# Patient Record
Sex: Female | Born: 1985 | Race: Black or African American | Hispanic: No | Smoking: Never smoker
Health system: Southern US, Community
[De-identification: ages and names within clinical notes are randomized; demographics above are authoritative.]

## PROBLEM LIST (undated history)

## (undated) DIAGNOSIS — T7840XA Allergy, unspecified, initial encounter: Secondary | ICD-10-CM

## (undated) DIAGNOSIS — F419 Anxiety disorder, unspecified: Secondary | ICD-10-CM

## (undated) DIAGNOSIS — A749 Chlamydial infection, unspecified: Secondary | ICD-10-CM

## (undated) DIAGNOSIS — F329 Major depressive disorder, single episode, unspecified: Secondary | ICD-10-CM

## (undated) DIAGNOSIS — IMO0002 Reserved for concepts with insufficient information to code with codable children: Secondary | ICD-10-CM

## (undated) DIAGNOSIS — F32A Depression, unspecified: Secondary | ICD-10-CM

## (undated) HISTORY — DX: Chlamydial infection, unspecified: A74.9

## (undated) HISTORY — DX: Depression, unspecified: F32.A

## (undated) HISTORY — DX: Reserved for concepts with insufficient information to code with codable children: IMO0002

## (undated) HISTORY — DX: Major depressive disorder, single episode, unspecified: F32.9

## (undated) HISTORY — DX: Allergy, unspecified, initial encounter: T78.40XA

## (undated) HISTORY — DX: Anxiety disorder, unspecified: F41.9

---
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
## 2005-04-12 ENCOUNTER — Ambulatory Visit: Payer: Self-pay | Admitting: Family Medicine

## 2005-06-21 ENCOUNTER — Ambulatory Visit: Payer: Self-pay | Admitting: Family Medicine

## 2006-06-05 ENCOUNTER — Ambulatory Visit: Payer: Self-pay | Admitting: Family Medicine

## 2006-07-19 ENCOUNTER — Ambulatory Visit: Payer: Self-pay | Admitting: Family Medicine

## 2007-01-07 ENCOUNTER — Ambulatory Visit: Payer: Self-pay | Admitting: Family Medicine

## 2007-01-09 ENCOUNTER — Ambulatory Visit: Payer: Self-pay | Admitting: Family Medicine

## 2010-06-05 ENCOUNTER — Emergency Department (HOSPITAL_COMMUNITY): Admission: EM | Admit: 2010-06-05 | Discharge: 2010-06-05 | Payer: Self-pay | Admitting: Emergency Medicine

## 2010-06-16 ENCOUNTER — Ambulatory Visit: Payer: Self-pay | Admitting: Family Medicine

## 2010-06-16 ENCOUNTER — Other Ambulatory Visit: Admission: RE | Admit: 2010-06-16 | Discharge: 2010-06-16 | Payer: Self-pay | Admitting: Family Medicine

## 2010-06-17 DIAGNOSIS — R87612 Low grade squamous intraepithelial lesion on cytologic smear of cervix (LGSIL): Secondary | ICD-10-CM

## 2010-06-17 DIAGNOSIS — IMO0002 Reserved for concepts with insufficient information to code with codable children: Secondary | ICD-10-CM

## 2010-06-17 HISTORY — DX: Reserved for concepts with insufficient information to code with codable children: IMO0002

## 2010-06-17 HISTORY — DX: Low grade squamous intraepithelial lesion on cytologic smear of cervix (LGSIL): R87.612

## 2010-06-28 ENCOUNTER — Encounter: Payer: Self-pay | Admitting: Physician Assistant

## 2010-12-06 NOTE — Letter (Signed)
Summary: FAXED REQUESTED INFO TO [facility]  FAXED REQUESTED INFO TO [facility]   Imported By: Arta Bruce 06/28/2010 14:21:47  _____________________________________________________________________  External Attachment:    Type:   Image     Comment:   External Document

## 2011-01-04 ENCOUNTER — Institutional Professional Consult (permissible substitution) (INDEPENDENT_AMBULATORY_CARE_PROVIDER_SITE_OTHER): Payer: BC Managed Care – PPO | Admitting: Family Medicine

## 2011-01-04 DIAGNOSIS — A749 Chlamydial infection, unspecified: Secondary | ICD-10-CM

## 2011-01-04 DIAGNOSIS — Z3009 Encounter for other general counseling and advice on contraception: Secondary | ICD-10-CM

## 2011-01-21 LAB — CBC
HCT: 37.2 % (ref 36.0–46.0)
Hemoglobin: 12.4 g/dL (ref 12.0–15.0)
MCH: 28.1 pg (ref 26.0–34.0)
MCHC: 33.5 g/dL (ref 30.0–36.0)
MCV: 84.1 fL (ref 78.0–100.0)
Platelets: 185 10*3/uL (ref 150–400)
RBC: 4.42 MIL/uL (ref 3.87–5.11)
RDW: 13.5 % (ref 11.5–15.5)
WBC: 7.9 10*3/uL (ref 4.0–10.5)

## 2011-01-21 LAB — D-DIMER, QUANTITATIVE: D-Dimer, Quant: 2.38 ug/mL-FEU — ABNORMAL HIGH (ref 0.00–0.48)

## 2011-01-21 LAB — COMPREHENSIVE METABOLIC PANEL
ALT: <8 U/L (ref 0–35)
AST: 15 U/L (ref 0–37)
Albumin: 3.6 g/dL (ref 3.5–5.2)
Alkaline Phosphatase: 53 U/L (ref 39–117)
BUN: 6 mg/dL (ref 6–23)
CO2: 28 mEq/L (ref 19–32)
Calcium: 8.8 mg/dL (ref 8.4–10.5)
Chloride: 104 mEq/L (ref 96–112)
Creatinine, Ser: 0.65 mg/dL (ref 0.4–1.2)
GFR calc Af Amer: >60 mL/min (ref >60)
GFR calc non Af Amer: >60 mL/min (ref >60)
Glucose, Bld: 86 mg/dL (ref 70–99)
Potassium: 3.4 mEq/L — ABNORMAL LOW (ref 3.5–5.1)
Sodium: 140 mEq/L (ref 135–145)
Total Bilirubin: 1.6 mg/dL — ABNORMAL HIGH (ref 0.3–1.2)
Total Protein: 7.2 g/dL (ref 6.0–8.3)

## 2011-01-21 LAB — URINALYSIS, ROUTINE W REFLEX MICROSCOPIC
Bilirubin Urine: NEGATIVE
Hgb urine dipstick: NEGATIVE
Ketones, ur: 15 mg/dL — AB
Nitrite: POSITIVE — AB
Urobilinogen, UA: 4 mg/dL — ABNORMAL HIGH (ref 0.0–1.0)

## 2011-01-21 LAB — DIFFERENTIAL
Basophils Absolute: 0 10*3/uL (ref 0.0–0.1)
Basophils Relative: 0 % (ref 0–1)
Eosinophils Absolute: 0 10*3/uL (ref 0.0–0.7)
Eosinophils Relative: 0 % (ref 0–5)
Neutrophils Relative %: 82 % — ABNORMAL HIGH (ref 43–77)

## 2011-01-21 LAB — URINE CULTURE
Colony Count: >=100000
Culture  Setup Time: 201107312126

## 2011-01-21 LAB — POCT PREGNANCY, URINE: Preg Test, Ur: NEGATIVE

## 2011-01-21 LAB — LIPASE, BLOOD: Lipase: 23 U/L (ref 11–59)

## 2011-01-23 ENCOUNTER — Ambulatory Visit: Payer: BC Managed Care – PPO | Admitting: Family Medicine

## 2011-01-26 ENCOUNTER — Ambulatory Visit: Payer: BC Managed Care – PPO | Admitting: Family Medicine

## 2011-02-01 ENCOUNTER — Other Ambulatory Visit: Payer: Self-pay | Admitting: Family Medicine

## 2011-02-01 ENCOUNTER — Other Ambulatory Visit (INDEPENDENT_AMBULATORY_CARE_PROVIDER_SITE_OTHER): Payer: BC Managed Care – PPO | Admitting: Family Medicine

## 2011-02-01 ENCOUNTER — Other Ambulatory Visit (HOSPITAL_COMMUNITY)
Admission: RE | Admit: 2011-02-01 | Discharge: 2011-02-01 | Disposition: A | Discharge status: Home / Self Care | Payer: BC Managed Care – PPO | Source: Ambulatory Visit | Attending: Family Medicine | Admitting: Family Medicine

## 2011-02-01 DIAGNOSIS — Z124 Encounter for screening for malignant neoplasm of cervix: Secondary | ICD-10-CM | POA: Insufficient documentation

## 2011-02-01 DIAGNOSIS — N760 Acute vaginitis: Secondary | ICD-10-CM

## 2011-02-01 DIAGNOSIS — R87619 Unspecified abnormal cytological findings in specimens from cervix uteri: Secondary | ICD-10-CM

## 2011-06-27 ENCOUNTER — Other Ambulatory Visit: Payer: Self-pay | Admitting: Family Medicine

## 2011-06-28 NOTE — Telephone Encounter (Signed)
Rx request 

## 2011-06-28 NOTE — Telephone Encounter (Signed)
Chart reviewed.  Due for pap.  Okay to refill x 2 and schedule CPE/pap.  Thanks

## 2011-06-28 NOTE — Telephone Encounter (Signed)
PT SCHEDULED CPE FOR 08/09/2011 @ 9:30AM

## 2011-08-08 ENCOUNTER — Encounter: Payer: Self-pay | Admitting: Family Medicine

## 2011-08-09 ENCOUNTER — Encounter: Payer: BC Managed Care – PPO | Admitting: Family Medicine

## 2011-10-27 ENCOUNTER — Encounter: Payer: BC Managed Care – PPO | Admitting: Medical

## 2011-12-01 ENCOUNTER — Encounter: Payer: Self-pay | Admitting: Internal Medicine

## 2011-12-06 ENCOUNTER — Encounter: Payer: BC Managed Care – PPO | Admitting: Family Medicine

## 2011-12-29 ENCOUNTER — Other Ambulatory Visit (HOSPITAL_COMMUNITY)
Admission: RE | Admit: 2011-12-29 | Discharge: 2011-12-29 | Disposition: A | Discharge status: Home / Self Care | Payer: BC Managed Care – PPO | Source: Ambulatory Visit | Attending: Family Medicine | Admitting: Family Medicine

## 2011-12-29 ENCOUNTER — Ambulatory Visit (INDEPENDENT_AMBULATORY_CARE_PROVIDER_SITE_OTHER): Payer: BC Managed Care – PPO | Admitting: Family Medicine

## 2011-12-29 ENCOUNTER — Encounter: Payer: Self-pay | Admitting: Family Medicine

## 2011-12-29 VITALS — BP 108/72 | HR 80 | Ht 66.5 in | Wt 137.0 lb

## 2011-12-29 DIAGNOSIS — R6889 Other general symptoms and signs: Secondary | ICD-10-CM

## 2011-12-29 DIAGNOSIS — Z309 Encounter for contraceptive management, unspecified: Secondary | ICD-10-CM

## 2011-12-29 DIAGNOSIS — Z01419 Encounter for gynecological examination (general) (routine) without abnormal findings: Secondary | ICD-10-CM | POA: Insufficient documentation

## 2011-12-29 DIAGNOSIS — Z0000 Encounter for general adult medical examination without abnormal findings: Secondary | ICD-10-CM

## 2011-12-29 DIAGNOSIS — Z13220 Encounter for screening for lipoid disorders: Secondary | ICD-10-CM

## 2011-12-29 DIAGNOSIS — N930 Postcoital and contact bleeding: Secondary | ICD-10-CM

## 2011-12-29 DIAGNOSIS — IMO0002 Reserved for concepts with insufficient information to code with codable children: Secondary | ICD-10-CM

## 2011-12-29 LAB — POCT URINALYSIS DIPSTICK
Glucose, UA: NEGATIVE
Ketones, UA: NEGATIVE
Spec Grav, UA: 1.02
Urobilinogen, UA: NEGATIVE

## 2011-12-29 MED ORDER — NORETHIN-ETH ESTRAD TRIPHASIC 0.5/0.75/1-35 MG-MCG PO TABS: 1.0000 | ORAL_TABLET | Freq: Every day | ORAL | Status: DC

## 2011-12-29 NOTE — Patient Instructions (Addendum)
HEALTH MAINTENANCE RECOMMENDATIONS:  It is recommended that you get at least 30 minutes of aerobic exercise at least 5 days/week (for weight loss, you may need as much as 60-90 minutes). This can be any activity that gets your heart rate up. This can be divided in 10-15 minute intervals if needed, but try and build up your endurance at least once a week.  Weight bearing exercise is also recommended twice weekly.  Eat a healthy diet with lots of vegetables, fruits and fiber.  "Colorful" foods have a lot of vitamins (ie green vegetables, tomatoes, red peppers, etc).  Limit sweet tea, regular sodas and alcoholic beverages, all of which has a lot of calories and sugar.  Up to 1 alcoholic drink daily may be beneficial for women (unless trying to lose weight, watch sugars).  Drink a lot of water.  Calcium recommendations are 1200-1500 mg daily (1500 mg for postmenopausal women or women without ovaries), and vitamin D 1000 IU daily.  This should be obtained from diet and/or supplements (vitamins), and calcium should not be taken all at once, but in divided doses.  Monthly self breast exams and yearly mammograms for women over the age of 68 is recommended.  Sunscreen of at least SPF 30 should be used on all sun-exposed parts of the skin when outside between the hours of 10 am and 4 pm (not just when at beach or pool, but even with exercise, golf, tennis, and yard work!)  Use a sunscreen that says "broad spectrum" so it covers both UVA and UVB rays, and make sure to reapply every 1-2 hours.  Remember to change the batteries in your smoke detectors when changing your clock times in the spring and fall.  Use your seat belt every time you are in a car, and please drive safely and not be distracted with cell phones and texting while driving.   Contraceptive management--refill OCP's.  To start Sunday after next period starts.  You are not protected from pregnancy until 2nd month of pills.  Encouraged to use  condoms for prevention of STD's (and for prevention of pregnancy until you on second month of pills to prevent pregnancy).

## 2011-12-29 NOTE — Progress Notes (Signed)
Maureen Waters is a 26 y.o. female who presents for a complete physical.  She has the following concerns:  Complaining of some spotting with intercourse x 2 weeks.  Denies any abnormal vaginal discharge--no odor, itching, or discolored discharge.  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  She returned in 01/2011 and repeat pap then was normal.  Was recommended she return for additional repeat pap in 4 months, but she never returned.  Treated for chlamydia in 12/2010 (that was noted on pap 05/2010), and repeat testing in March was negative.  Has been off of her OCP's for 3 months.  Uses condoms most of the time, but has had unprotected sex.  Hasn't had unprotected sex since her last period, last week.  Period was normal, on time.  Immunization History  Administered Date(s) Administered  . MMR 05/31/2004  . Tdap 06/16/2010   Last Pap smear: 01/2011 Last mammogram:  never Last colonoscopy: never Last DEXA: never Dentist: yearly Ophtho: never Exercise: 3-4 times/week cardio and weights  Past Medical History  Diagnosis Date  . Allergy     HX OF SEASONAL ALLERGIES  . 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  01/2011 pap was normal  . Chlamydia     06/2010    History reviewed. No pertinent past surgical history.  History   Social History  . Marital Status: Single    Spouse Name: N/A    Number of Children: 0  . Years of Education: N/A   Occupational History  . Production designer, theatre/television/film at [employer]     Social History Main Topics  . Smoking status: Never Smoker   . Smokeless tobacco: Never Used  . Alcohol Use: No  . Drug Use: No  . Sexually Active: Yes -- Female partner(s)    Birth Control/ Protection: Condom, OCP   Other Topics Concern  . Not on file   Social History Narrative   Lives alone, no pets.  Family lives in [place]    Family History  Problem Relation Age of Onset  .  Arthritis Maternal Grandmother   . Diabetes Maternal Grandfather   . Hypertension Maternal Grandfather   . Cancer Neg Hx   . Asthma Brother     and allergies    Current outpatient prescriptions:norethindrone-ethinyl estradiol (NORTREL 7/7/7) 0.5/0.75/1-35 MG-MCG tablet, Take 1 tablet by mouth daily., Disp: 1 Package, Rfl: 11  No Known Allergies  ROS: The patient denies anorexia, fever, weight changes, headaches,  vision changes, decreased hearing, ear pain, sore throat, breast concerns, chest pain, palpitations, dizziness, syncope, dyspnea on exertion, cough, swelling, nausea, vomiting, diarrhea, constipation, abdominal pain, melena, hematochezia, indigestion/heartburn, hematuria, incontinence, dysuria, irregular menstrual cycles, vaginal discharge, odor or itch, genital lesions, joint pains, numbness, tingling, weakness, tremor, suspicious skin lesions, depression, anxiety, abnormal bleeding/bruising, or enlarged lymph nodes.  PHYSICAL EXAM: BP 108/72  Pulse 80  Ht 5' 6.5" (1.689 m)  Wt 137 lb (62.143 kg)  BMI 21.78 kg/m2  LMP 12/18/2011  General Appearance:    Alert, cooperative, no distress, appears stated age  Head:    Normocephalic, without obvious abnormality, atraumatic  Eyes:    PERRL, conjunctiva/corneas clear, EOM's intact, fundi    benign  Ears:    Normal TM's and external ear canals  Nose:   Nares normal, mucosa normal, no drainage or sinus   tenderness  Throat:   Lips, mucosa, and tongue normal; teeth and gums  normal  Neck:   Supple, no lymphadenopathy;  thyroid:  no   enlargement/tenderness/nodules; no carotid   bruit or JVD  Back:    Spine nontender, no curvature, ROM normal, no CVA     tenderness  Lungs:     Clear to auscultation bilaterally without wheezes, rales or     ronchi; respirations unlabored  Chest Wall:    No tenderness or deformity   Heart:    Regular rate and rhythm, S1 and S2 normal, no murmur, rub   or gallop  Breast Exam:    No tenderness, masses,  or nipple discharge or inversion.      No axillary lymphadenopathy  Abdomen:     Soft, non-tender, nondistended, normoactive bowel sounds,    no masses, no hepatosplenomegaly  Genitalia:    Normal external genitalia without lesions.  BUS and vagina normal; Cervix--inflamed, easily bleeding.  Blood obscured some of exam, but appeared to have slightly abnormal tissue on upper part of cervix.  Whitish yellow discharge at cervical os.  No cervical motion tenderness.  Uterus and adnexa not enlarged, nontender, no masses.  Pap performed  Rectal:    Not performed due to age<40 and no related complaints  Extremities:   No clubbing, cyanosis or edema  Pulses:   2+ and symmetric all extremities  Skin:   Skin color, texture, turgor normal, no rashes or lesions  Lymph nodes:   Cervical, supraclavicular, and axillary nodes normal  Neurologic:   CNII-XII intact, normal strength, sensation and gait; reflexes 2+ and symmetric throughout          Psych:   Normal mood, affect, hygiene and grooming.    ASSESSMENT/PLAN:  1. Routine general medical examination at a health care facility  POCT Urinalysis Dipstick, Visual acuity screening, Cytology - PAP, CBC with Differential, Lipid panel, HIV Antibody  2. Screening for lipoid disorders  Lipid panel  3. Abnormal Pap smear  Cytology - PAP  4. Postcoital bleeding  Cytology - PAP, CBC with Differential  5. Unspecified contraceptive management  norethindrone-ethinyl estradiol (NORTREL 7/7/7) 0.5/0.75/1-35 MG-MCG tablet    Contraceptive management--refill OCP's.  To start Sunday after her next period.  Understands that she is not protected from pregnancy until 2nd month of pills.  Encouraged to use condoms for prevention of STD's (and for prevention of pregnancy until she is on second month of pills)  Discussed monthly self breast exams and yearly mammograms after the age of 79; at least 30 minutes of aerobic activity at least 5 days/week; proper sunscreen use reviewed;  healthy diet, including goals of calcium and vitamin D intake and alcohol recommendations (less than or equal to 1 drink/day) reviewed; regular seatbelt use; changing batteries in smoke detectors.  Immunization recommendations discussed--recommended annual flu shots.

## 2011-12-30 LAB — CBC WITH DIFFERENTIAL/PLATELET
Eosinophils Absolute: 0 10*3/uL (ref 0.0–0.7)
Eosinophils Relative: 1 % (ref 0–5)
HCT: 41.4 % (ref 36.0–46.0)
Lymphocytes Relative: 49 % — ABNORMAL HIGH (ref 12–46)
Lymphs Abs: 1.5 10*3/uL (ref 0.7–4.0)
MCH: 26.6 pg (ref 26.0–34.0)
MCV: 83.5 fL (ref 78.0–100.0)
Monocytes Absolute: 0.3 10*3/uL (ref 0.1–1.0)
Monocytes Relative: 9 % (ref 3–12)
Platelets: 212 10*3/uL (ref 150–400)
RBC: 4.96 MIL/uL (ref 3.87–5.11)
WBC: 3 10*3/uL — ABNORMAL LOW (ref 4.0–10.5)

## 2011-12-30 LAB — LIPID PANEL
HDL: 61 mg/dL (ref >39)
LDL Cholesterol: 52 mg/dL (ref 0–99)
Total CHOL/HDL Ratio: 2 Ratio
VLDL: 6 mg/dL (ref 0–40)

## 2011-12-30 LAB — HIV ANTIBODY (ROUTINE TESTING W REFLEX): HIV: NONREACTIVE

## 2012-01-03 ENCOUNTER — Other Ambulatory Visit: Payer: Self-pay | Admitting: *Deleted

## 2012-01-03 DIAGNOSIS — A749 Chlamydial infection, unspecified: Secondary | ICD-10-CM

## 2012-01-03 MED ORDER — AZITHROMYCIN 500 MG PO TABS: 1000.0000 mg | ORAL_TABLET | Freq: Once | ORAL | Status: DC
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
## 2012-01-31 ENCOUNTER — Encounter (INDEPENDENT_AMBULATORY_CARE_PROVIDER_SITE_OTHER): Payer: BC Managed Care – PPO | Admitting: Family Medicine

## 2012-02-07 ENCOUNTER — Ambulatory Visit: Payer: BC Managed Care – PPO | Admitting: Family Medicine

## 2013-04-21 ENCOUNTER — Encounter: Payer: Self-pay | Admitting: Family Medicine

## 2013-04-23 NOTE — Progress Notes (Signed)
Patient ID: Maureen Waters, female   DOB: 1986/01/07, 27 y.o.   MRN: [id number] ek/error

## 2013-06-25 ENCOUNTER — Encounter (HOSPITAL_BASED_OUTPATIENT_CLINIC_OR_DEPARTMENT_OTHER): Payer: Self-pay

## 2013-06-25 ENCOUNTER — Emergency Department (HOSPITAL_BASED_OUTPATIENT_CLINIC_OR_DEPARTMENT_OTHER): Payer: Self-pay

## 2013-06-25 ENCOUNTER — Emergency Department (HOSPITAL_BASED_OUTPATIENT_CLINIC_OR_DEPARTMENT_OTHER)
Admission: EM | Admit: 2013-06-25 | Discharge: 2013-06-25 | Disposition: A | Discharge status: Home / Self Care | Payer: Self-pay | Attending: Emergency Medicine | Admitting: Emergency Medicine

## 2013-06-25 DIAGNOSIS — S6000XA Contusion of unspecified finger without damage to nail, initial encounter: Secondary | ICD-10-CM | POA: Insufficient documentation

## 2013-06-25 DIAGNOSIS — R209 Unspecified disturbances of skin sensation: Secondary | ICD-10-CM | POA: Insufficient documentation

## 2013-06-25 DIAGNOSIS — W230XXA Caught, crushed, jammed, or pinched between moving objects, initial encounter: Secondary | ICD-10-CM | POA: Insufficient documentation

## 2013-06-25 DIAGNOSIS — Z8619 Personal history of other infectious and parasitic diseases: Secondary | ICD-10-CM | POA: Insufficient documentation

## 2013-06-25 DIAGNOSIS — S60011A Contusion of right thumb without damage to nail, initial encounter: Secondary | ICD-10-CM

## 2013-06-25 DIAGNOSIS — Y9389 Activity, other specified: Secondary | ICD-10-CM | POA: Insufficient documentation

## 2013-06-25 DIAGNOSIS — Y9289 Other specified places as the place of occurrence of the external cause: Secondary | ICD-10-CM | POA: Insufficient documentation

## 2013-06-25 NOTE — ED Notes (Signed)
Slammed right thumb in car door

## 2013-06-25 NOTE — ED Provider Notes (Signed)
CSN: [id number]     Arrival date & time 06/25/13  1922 History     First MD Initiated Contact with Patient 06/25/13 2043     Chief Complaint  Patient presents with  . Finger Injury   (Consider location/radiation/quality/duration/timing/severity/associated sxs/prior Treatment) Patient is a 27 y.o. female presenting with hand injury.  Hand Injury Location:  Finger Injury: yes   Mechanism of injury: crush   Crush injury:    Mechanism:  Door   Approximate weight of object:  Car door Finger location:  R thumb Pain details:    Quality:  Sharp and throbbing   Radiates to:  R forearm   Severity:  Moderate   Onset quality:  Sudden   Timing:  Constant   Progression:  Unchanged Chronicity:  New Handedness:  Right-handed Dislocation: no   Foreign body present:  No foreign bodies Prior injury to area:  No Relieved by:  Nothing Worsened by:  Nothing tried Ineffective treatments:  None tried Associated symptoms: numbness   Associated symptoms: no back pain, no decreased range of motion, no fatigue, no fever and no muscle weakness     Past Medical History  Diagnosis Date  . Allergy     HX OF SEASONAL ALLERGIES  . 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  01/2011 pap was normal  . Chlamydia     06/2010   History reviewed. No pertinent past surgical history. Family History  Problem Relation Age of Onset  . Arthritis Maternal Grandmother   . Diabetes Maternal Grandfather   . Hypertension Maternal Grandfather   . Cancer Neg Hx   . Asthma Brother     and allergies   History  Substance Use Topics  . Smoking status: Never Smoker   . Smokeless tobacco: Never Used  . Alcohol Use: No   OB History   Grav Para Term Preterm Abortions TAB SAB Ect Mult Living   0 0 0 0 0 0 0 0 0 0      Review of Systems  Constitutional: Negative for fever and fatigue.  Musculoskeletal: Negative for back pain.  All other systems reviewed and  are negative.    Allergies  Review of patient's allergies indicates no known allergies.  Home Medications   Current Outpatient Rx  Name  Route  Sig  Dispense  Refill  . azithromycin (ZITHROMAX) 500 MG tablet   Oral   Take 2 tablets (1,000 mg total) by mouth once.   2 tablet   0   . norethindrone-ethinyl estradiol (NORTREL 7/7/7) 0.5/0.75/1-35 MG-MCG tablet   Oral   Take 1 tablet by mouth daily.   1 Package   11    BP 122/71  Pulse 62  Temp(Src) 98.6 F (37 C) (Oral)  Resp 16  Ht 5\' 6"  (1.676 m)  Wt 135 lb (61.236 kg)  BMI 21.8 kg/m2  SpO2 100%  LMP 06/23/2013 Physical Exam  Nursing note and vitals reviewed. Constitutional: She is oriented to person, place, and time. She appears well-developed and well-nourished.  HENT:  Head: Normocephalic and atraumatic.  Musculoskeletal:       Right hand: She exhibits tenderness. She exhibits normal range of motion, no bony tenderness, normal capillary refill, no deformity and no laceration. Decreased sensation is not present in the ulnar distribution and is not present in the medial distribution. Normal strength noted.       Hands: Neurological: She is alert and oriented to person, place,  and time.  Psychiatric: She has a normal mood and affect. Her behavior is normal. Judgment and thought content normal.    ED Course   Procedures (including critical care time)  Labs Reviewed - No data to display Dg Finger Thumb Right  06/25/2013   *RADIOLOGY REPORT*  Clinical Data: Finger injury  RIGHT THUMB 2+V  Comparison: None.  Findings: Three views of the right thumb submitted.  No acute fracture or subluxation.  IMPRESSION: No acute fracture or subluxation.   Original Report Authenticated By: Natasha Mead, M.D.   1. Thumb contusion, right, initial encounter     MDM    Hilario Quarry, MD 06/25/13 [phone]

## 2014-06-08 ENCOUNTER — Encounter (HOSPITAL_COMMUNITY): Payer: Self-pay | Admitting: Emergency Medicine

## 2014-06-08 ENCOUNTER — Emergency Department (HOSPITAL_COMMUNITY)
Admission: EM | Admit: 2014-06-08 | Discharge: 2014-06-08 | Disposition: A | Discharge status: Home / Self Care | Payer: No Typology Code available for payment source | Attending: Emergency Medicine | Admitting: Emergency Medicine

## 2014-06-08 DIAGNOSIS — Y92410 Unspecified street and highway as the place of occurrence of the external cause: Secondary | ICD-10-CM | POA: Insufficient documentation

## 2014-06-08 DIAGNOSIS — IMO0002 Reserved for concepts with insufficient information to code with codable children: Secondary | ICD-10-CM | POA: Diagnosis present

## 2014-06-08 DIAGNOSIS — Z8619 Personal history of other infectious and parasitic diseases: Secondary | ICD-10-CM | POA: Insufficient documentation

## 2014-06-08 DIAGNOSIS — Y9389 Activity, other specified: Secondary | ICD-10-CM | POA: Diagnosis not present

## 2014-06-08 DIAGNOSIS — M549 Dorsalgia, unspecified: Secondary | ICD-10-CM

## 2014-06-08 MED ORDER — OXYCODONE-ACETAMINOPHEN 5-325 MG PO TABS: 1.0000 | ORAL_TABLET | ORAL | Status: DC | PRN

## 2014-06-08 MED ORDER — METHOCARBAMOL 500 MG PO TABS: 500.0000 mg | ORAL_TABLET | Freq: Two times a day (BID) | ORAL | Status: DC
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
## 2014-06-08 NOTE — ED Notes (Signed)
Declined W/C at D/C and was escorted to lobby by RN. 
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
## 2014-06-08 NOTE — Discharge Instructions (Signed)
Take the prescribed medication as directed.  You will continue to be sore for the next few days which is normal.  May wish to apply heat to affected areas for added relief. Follow-up with your primary care physician. Return to the ED for new or worsening symptoms.

## 2014-06-09 NOTE — ED Provider Notes (Signed)
Medical screening examination/treatment/procedure(s) were performed by non-physician practitioner and as supervising physician I was immediately available for consultation/collaboration.   EKG Interpretation None       Kregg Cihlar, MD 06/09/14 0110 

## 2014-07-07 ENCOUNTER — Other Ambulatory Visit: Payer: Self-pay | Admitting: Obstetrics and Gynecology

## 2014-07-08 LAB — CYTOLOGY - PAP

## 2014-08-05 ENCOUNTER — Telehealth: Payer: Self-pay | Admitting: Internal Medicine

## 2014-08-05 NOTE — Telephone Encounter (Signed)
[facility] 07/31/14 @ [phone]

## 2014-10-25 ENCOUNTER — Emergency Department (HOSPITAL_BASED_OUTPATIENT_CLINIC_OR_DEPARTMENT_OTHER)
Admission: EM | Admit: 2014-10-25 | Discharge: 2014-10-25 | Disposition: A | Discharge status: Home / Self Care | Payer: 59 | Attending: Emergency Medicine | Admitting: Emergency Medicine

## 2014-10-25 ENCOUNTER — Encounter (HOSPITAL_BASED_OUTPATIENT_CLINIC_OR_DEPARTMENT_OTHER): Payer: Self-pay | Admitting: *Deleted

## 2014-10-25 DIAGNOSIS — R55 Syncope and collapse: Secondary | ICD-10-CM

## 2014-10-25 DIAGNOSIS — Z3202 Encounter for pregnancy test, result negative: Secondary | ICD-10-CM | POA: Insufficient documentation

## 2014-10-25 DIAGNOSIS — R42 Dizziness and giddiness: Secondary | ICD-10-CM | POA: Insufficient documentation

## 2014-10-25 DIAGNOSIS — Z8619 Personal history of other infectious and parasitic diseases: Secondary | ICD-10-CM | POA: Diagnosis not present

## 2014-10-25 DIAGNOSIS — R531 Weakness: Secondary | ICD-10-CM | POA: Diagnosis not present

## 2014-10-25 LAB — URINALYSIS, ROUTINE W REFLEX MICROSCOPIC
Bilirubin Urine: NEGATIVE
GLUCOSE, UA: NEGATIVE mg/dL
KETONES UR: NEGATIVE mg/dL
Nitrite: NEGATIVE
PH: 7.5 (ref 5.0–8.0)
Protein, ur: NEGATIVE mg/dL
SPECIFIC GRAVITY, URINE: 1.003 — AB (ref 1.005–1.030)
Urobilinogen, UA: 1 mg/dL (ref 0.0–1.0)

## 2014-10-25 LAB — BASIC METABOLIC PANEL
Anion gap: 14 (ref 5–15)
BUN: 8 mg/dL (ref 6–23)
CO2: 27 meq/L (ref 19–32)
Calcium: 9.6 mg/dL (ref 8.4–10.5)
Chloride: 100 mEq/L (ref 96–112)
Creatinine, Ser: 0.7 mg/dL (ref 0.50–1.10)
GFR calc Af Amer: >90 mL/min (ref >90)
GFR calc non Af Amer: >90 mL/min (ref >90)
GLUCOSE: 87 mg/dL (ref 70–99)
POTASSIUM: 3.6 meq/L — AB (ref 3.7–5.3)
SODIUM: 141 meq/L (ref 137–147)

## 2014-10-25 LAB — CBC WITH DIFFERENTIAL/PLATELET
Basophils Absolute: 0 10*3/uL (ref 0.0–0.1)
Basophils Relative: 0 % (ref 0–1)
EOS PCT: 0 % (ref 0–5)
Eosinophils Absolute: 0 10*3/uL (ref 0.0–0.7)
HEMATOCRIT: 39.3 % (ref 36.0–46.0)
Hemoglobin: 12.8 g/dL (ref 12.0–15.0)
LYMPHS ABS: 1.8 10*3/uL (ref 0.7–4.0)
LYMPHS PCT: 39 % (ref 12–46)
MCH: 26.8 pg (ref 26.0–34.0)
MCHC: 32.6 g/dL (ref 30.0–36.0)
MCV: 82.2 fL (ref 78.0–100.0)
MONO ABS: 0.4 10*3/uL (ref 0.1–1.0)
Monocytes Relative: 8 % (ref 3–12)
NEUTROS ABS: 2.4 10*3/uL (ref 1.7–7.7)
Neutrophils Relative %: 53 % (ref 43–77)
PLATELETS: 183 10*3/uL (ref 150–400)
RBC: 4.78 MIL/uL (ref 3.87–5.11)
RDW: 12.5 % (ref 11.5–15.5)
WBC: 4.6 10*3/uL (ref 4.0–10.5)

## 2014-10-25 LAB — URINE MICROSCOPIC-ADD ON

## 2014-10-25 LAB — PREGNANCY, URINE: PREG TEST UR: NEGATIVE

## 2014-10-25 MED ORDER — SODIUM CHLORIDE 0.9 % IV BOLUS (SEPSIS)
1000.0000 mL | Freq: Once | INTRAVENOUS | Status: AC
  Administered 2014-10-25: 1000 mL via INTRAVENOUS

## 2014-10-25 NOTE — ED Notes (Signed)
"  feels better, less dizzy", (denies: nausea, HA, pain, sob or other sx).

## 2014-10-25 NOTE — ED Notes (Signed)
Denies questions needs sx or concerns unmet, steady gait, out in w/c.

## 2014-10-25 NOTE — ED Notes (Signed)
Dr. Belfi at [facility] 

## 2014-10-25 NOTE — ED Provider Notes (Signed)
CSN: [id number]     Arrival date & time 10/25/14  2009 History  This chart was scribed for Rolan Bucco, MD by Haywood Pao, ED Scribe. The patient was seen in [facility] and the patient's care was started at 9:36 PM.  Chief Complaint  Patient presents with  . Loss of Consciousness   HPI  HPI Comments: Maureen Waters is a 28 y.o. female who presents to the Emergency Department complaining of LOC She had a dance performance tonight and she passed out while giving out costumes. She was light headed and had nausea at the time of LOC. She was given a cool compress, food and water which improved her symptoms. Then at the next performance she developed weakness and felt like she might pass out again. Pt currently feels dizzy, light headed, has a slight HA. The dizziness is described as room spinning. Pt denies nausea right now, cough, cold, congestion, numbness and weakness in extremities, speech difficulty, CP and heart palpations.  Pt has no history of this complaint, but was light headed before due to lack of food. She has only eaten one time today. 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  Past Medical History  Diagnosis Date  . Allergy     HX OF SEASONAL ALLERGIES  . 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  01/2011 pap was normal  . Chlamydia     06/2010   History reviewed. No pertinent past surgical history. Family History  Problem Relation Age of Onset  . Arthritis Maternal Grandmother   . Diabetes Maternal Grandfather   . Hypertension Maternal Grandfather   . Cancer Neg Hx   . Asthma Brother     and allergies   History  Substance Use Topics  . Smoking status: Never Smoker   . Smokeless tobacco: Never Used  . Alcohol Use: No   OB History    Gravida Para Term Preterm AB TAB SAB Ectopic Multiple Living   0 0 0 0 0 0 0 0 0 0      Review of Systems  Constitutional: Positive for fatigue. Negative for fever, chills and diaphoresis.   HENT: Negative for congestion, rhinorrhea and sneezing.   Eyes: Negative.   Respiratory: Negative for cough, chest tightness and shortness of breath.   Cardiovascular: Negative for chest pain, palpitations and leg swelling.  Gastrointestinal: Negative for nausea, vomiting, abdominal pain, diarrhea and blood in stool.  Genitourinary: Negative for frequency, hematuria, flank pain and difficulty urinating.  Musculoskeletal: Negative for back pain and arthralgias.  Skin: Negative for rash.  Neurological: Positive for dizziness, syncope, weakness and light-headedness. Negative for speech difficulty, numbness and headaches.    Allergies  Peanut-containing drug products  Home Medications   Prior to Admission medications   Not on File   BP 118/69 mmHg  Pulse 86  Temp(Src) 98.7 F (37.1 C) (Oral)  Resp 16  Ht 5\' 6"  (1.676 m)  Wt 130 lb (58.968 kg)  BMI 20.99 kg/m2  SpO2 100%  LMP 10/11/2014 Physical Exam  Constitutional: She is oriented to person, place, and time. She appears well-developed and well-nourished.  HENT:  Head: Normocephalic and atraumatic.  Eyes: Pupils are equal, round, and reactive to light.  Neck: Normal range of motion. Neck supple.  Cardiovascular: Normal rate, regular rhythm and normal heart sounds.   Pulmonary/Chest: Effort normal and breath sounds normal. No respiratory distress. She has no wheezes. She has no rales. She exhibits no tenderness.  Abdominal: Soft. Bowel sounds are normal. There is no tenderness. There is no rebound and no guarding.  Musculoskeletal: Normal range of motion. She exhibits no edema.  Lymphadenopathy:    She has no cervical adenopathy.  Neurological: She is alert and oriented to person, place, and time. She has normal strength. No cranial nerve deficit or sensory deficit. GCS eye subscore is 4. GCS verbal subscore is 5. GCS motor subscore is 6.  FTN intact  Skin: Skin is warm and dry. No rash noted.  Psychiatric: She has a normal  mood and affect.    ED Course  Procedures  DIAGNOSTIC STUDIES: Oxygen Saturation is 100% on room air, normal by my interpretation.    COORDINATION OF CARE: 9:41 PM Discussed treatment plan with pt at bedside and pt agreed to plan.  Labs Review Results for orders placed or performed during the hospital encounter of 10/25/14  Basic metabolic panel  Result Value Ref Range   Sodium 141 137 - 147 mEq/L   Potassium 3.6 (L) 3.7 - 5.3 mEq/L   Chloride 100 96 - 112 mEq/L   CO2 27 19 - 32 mEq/L   Glucose, Bld 87 70 - 99 mg/dL   BUN 8 6 - 23 mg/dL   Creatinine, Ser 0.100.70 0.50 - 1.10 mg/dL   Calcium 9.6 8.4 - 27.210.5 mg/dL   GFR calc non Af Amer >90 >90 mL/min   GFR calc Af Amer >90 >90 mL/min   Anion gap 14 5 - 15  CBC with Differential  Result Value Ref Range   WBC 4.6 4.0 - 10.5 K/uL   RBC 4.78 3.87 - 5.11 MIL/uL   Hemoglobin 12.8 12.0 - 15.0 g/dL   HCT 53.639.3 64.436.0 - 03.446.0 %   MCV 82.2 78.0 - 100.0 fL   MCH 26.8 26.0 - 34.0 pg   MCHC 32.6 30.0 - 36.0 g/dL   RDW 74.212.5 59.511.5 - 63.815.5 %   Platelets 183 150 - 400 K/uL   Neutrophils Relative % 53 43 - 77 %   Neutro Abs 2.4 1.7 - 7.7 K/uL   Lymphocytes Relative 39 12 - 46 %   Lymphs Abs 1.8 0.7 - 4.0 K/uL   Monocytes Relative 8 3 - 12 %   Monocytes Absolute 0.4 0.1 - 1.0 K/uL   Eosinophils Relative 0 0 - 5 %   Eosinophils Absolute 0.0 0.0 - 0.7 K/uL   Basophils Relative 0 0 - 1 %   Basophils Absolute 0.0 0.0 - 0.1 K/uL  Urinalysis, Routine w reflex microscopic  Result Value Ref Range   Color, Urine YELLOW YELLOW   APPearance CLEAR CLEAR   Specific Gravity, Urine 1.003 (L) 1.005 - 1.030   pH 7.5 5.0 - 8.0   Glucose, UA NEGATIVE NEGATIVE mg/dL   Hgb urine dipstick TRACE (A) NEGATIVE   Bilirubin Urine NEGATIVE NEGATIVE   Ketones, ur NEGATIVE NEGATIVE mg/dL   Protein, ur NEGATIVE NEGATIVE mg/dL   Urobilinogen, UA 1.0 0.0 - 1.0 mg/dL   Nitrite NEGATIVE NEGATIVE   Leukocytes, UA TRACE (A) NEGATIVE  Pregnancy, urine  Result Value Ref  Range   Preg Test, Ur NEGATIVE NEGATIVE  Urine microscopic-add on  Result Value Ref Range   Squamous Epithelial / LPF RARE RARE   WBC, UA 0-2 <3 WBC/hpf   RBC / HPF 0-2 <3 RBC/hpf   Bacteria, UA RARE RARE   No results found.   Imaging Review No results found.   EKG Interpretation None  MDM   Final diagnoses:  Vasovagal syncope    Pt feeling much better after IVFs and something to eat.  Ambulating without symptoms.  Does not sound cardiac in nature.  Labs unremarkable.  No suggestions of seizure, PE.  Likely vasovagal.  Return precautions given.    Rolan BuccoMelanie Shantale Holtmeyer, MD 10/25/14 [phone]

## 2014-10-25 NOTE — ED Notes (Signed)
Pt c/o syncopal episode while dancing x 2 hrs ago c/o dizziness

## 2014-10-25 NOTE — ED Notes (Signed)
Back from b/r w/o change or incident. Steady gait. Alert, NAD, calm, interactive, resps e/u, speaking in clear complete sentences, c/o dizziness, room spinning, drained of energy and light sensitive. (denies: other sx or complaints). [facility]

## 2014-10-25 NOTE — ED Notes (Signed)
Pt alert, NAD, calm, interactive. Family at side. C/o dizziness and light sensitive. Up to b/r. Slow steady cautious gait.

## 2014-10-25 NOTE — Discharge Instructions (Signed)
Syncope °Syncope is a medical term for fainting or passing out. This means you lose consciousness and drop to the ground. People are generally unconscious for less than 5 minutes. You may have some muscle twitches for up to 15 seconds before waking up and returning to normal. Syncope occurs more often in older adults, but it can happen to anyone. While most causes of syncope are not dangerous, syncope can be a sign of a serious medical problem. It is important to seek medical care.  °CAUSES  °Syncope is caused by a sudden drop in blood flow to the brain. The specific cause is often not determined. Factors that can bring on syncope include: °· Taking medicines that lower blood pressure. °· Sudden changes in posture, such as standing up quickly. °· Taking more medicine than prescribed. °· Standing in one place for too long. °· Seizure disorders. °· Dehydration and excessive exposure to heat. °· Low blood sugar (hypoglycemia). °· Straining to have a bowel movement. °· Heart disease, irregular heartbeat, or other circulatory problems. °· Fear, emotional distress, seeing blood, or severe pain. °SYMPTOMS  °Right before fainting, you may: °· Feel dizzy or light-headed. °· Feel nauseous. °· See all white or all black in your field of vision. °· Have cold, clammy skin. °DIAGNOSIS  °Your health care provider will ask about your symptoms, perform a physical exam, and perform an electrocardiogram (ECG) to record the electrical activity of your heart. Your health care provider may also perform other heart or blood tests to determine the cause of your syncope which may include: °· Transthoracic echocardiogram (TTE). During echocardiography, sound waves are used to evaluate how blood flows through your heart. °· Transesophageal echocardiogram (TEE). °· Cardiac monitoring. This allows your health care provider to monitor your heart rate and rhythm in real time. °· Holter monitor. This is a portable device that records your  heartbeat and can help diagnose heart arrhythmias. It allows your health care provider to track your heart activity for several days, if needed. °· Stress tests by exercise or by giving medicine that makes the heart beat faster. °TREATMENT  °In most cases, no treatment is needed. Depending on the cause of your syncope, your health care provider may recommend changing or stopping some of your medicines. °HOME CARE INSTRUCTIONS °· Have someone stay with you until you feel stable. °· Do not drive, use machinery, or play sports until your health care provider says it is okay. °· Keep all follow-up appointments as directed by your health care provider. °· Lie down right away if you start feeling like you might faint. Breathe deeply and steadily. Wait until all the symptoms have passed. °· Drink enough fluids to keep your urine clear or pale yellow. °· If you are taking blood pressure or heart medicine, get up slowly and take several minutes to sit and then stand. This can reduce dizziness. °SEEK IMMEDIATE MEDICAL CARE IF:  °· You have a severe headache. °· You have unusual pain in the chest, abdomen, or back. °· You are bleeding from your mouth or rectum, or you have black or tarry stool. °· You have an irregular or very fast heartbeat. °· You have pain with breathing. °· You have repeated fainting or seizure-like jerking during an episode. °· You faint when sitting or lying down. °· You have confusion. °· You have trouble walking. °· You have severe weakness. °· You have vision problems. °If you fainted, call your local emergency services (911 in [place] Do not drive   yourself to the hospital.  °MAKE SURE YOU: °· Understand these instructions. °· Will watch your condition. °· Will get help right away if you are not doing well or get worse. °Document Released: 10/23/2005 Document Revised: 10/28/2013 Document Reviewed: 12/22/2011 °Exit[employer]® Patient Information ©2015 [employer]. This information is not intended to replace  advice given to you by your health care provider. Make sure you discuss any questions you have with your health care provider. ° °

## 2015-03-31 ENCOUNTER — Other Ambulatory Visit: Payer: Self-pay | Admitting: Obstetrics and Gynecology

## 2016-08-22 ENCOUNTER — Ambulatory Visit (INDEPENDENT_AMBULATORY_CARE_PROVIDER_SITE_OTHER): Payer: 59 | Admitting: Nurse Practitioner

## 2016-08-22 ENCOUNTER — Encounter: Payer: Self-pay | Admitting: Nurse Practitioner

## 2016-08-22 DIAGNOSIS — Z23 Encounter for immunization: Secondary | ICD-10-CM | POA: Diagnosis not present

## 2016-08-22 DIAGNOSIS — J302 Other seasonal allergic rhinitis: Secondary | ICD-10-CM

## 2016-08-22 DIAGNOSIS — Z0000 Encounter for general adult medical examination without abnormal findings: Secondary | ICD-10-CM

## 2016-08-22 DIAGNOSIS — H547 Unspecified visual loss: Secondary | ICD-10-CM | POA: Diagnosis not present

## 2016-08-22 NOTE — Patient Instructions (Signed)
URI Instructions: Flonase and Afrin use: apply 1spray of afrin in each nare, wait , then apply 2sprays of flonase in each nare. Use both nasal spray consecutively x 3days, then flonase only for at least 14days.  Use over-the-counter  "cold" medicines  such as "Tylenol cold" , "Advil cold",  "Mucinex" or" Mucinex D"  for cough and congestion.  Avoid decongestants if you have high blood pressure. Do not take decongestants for more than 3days. Use" Delsym" or" Robitussin" cough syrup varietis for cough.  You can use plain "Tylenol" or "Advi"l for fever, chills and achyness.   "Common cold" symptoms are usually triggered by a virus.  The antibiotics are usually not necessary. On average, a" viral cold" illness would take 4-7 days to resolve. Please, make an appointment if you are not better or if you're worse.

## 2016-08-22 NOTE — Progress Notes (Signed)
Pre visit review using our clinic review tool, if applicable. No additional management support is needed unless otherwise documented below in the visit note. 

## 2016-08-22 NOTE — Progress Notes (Signed)
Subjective:    Patient ID: Maureen Waters, female    DOB: 12/20/1985, 30 y.o.   MRN: [id number]  Patient presents today for complete physical or establish care (new patient)   Eye Problem   Both (difficulty focusing on close objects) eyes are affected.This is a chronic problem. The current episode started more than 1 month ago. The problem occurs constantly. The problem has been unchanged. There was no injury mechanism. The patient is experiencing no pain. There is no known exposure to pink eye. She does not wear contacts. Associated symptoms include blurred vision and photophobia. Pertinent negatives include no eye discharge, double vision, eye redness, fever, foreign body sensation, itching, nausea, recent URI, tingling or vomiting. She has tried nothing for the symptoms.  never had opthalmologist exam. No FH of premature glaucoma or cataracts.  Immunizations: (TDAP, Influenza)  Health Maintenance  Topic Date Due  . Pap Smear  07/07/2017  . Tetanus Vaccine  06/16/2020  . Flu Shot  Completed  . HIV Screening  Completed   Diet:healthy Weight:  Wt Readings from Last 3 Encounters:  10/25/14 130 lb (59 kg)  06/08/14 135 lb (61.2 kg)  06/25/13 135 lb (61.2 kg)   Exercise:dancing, walking Home Safety:single, home alone Depression/Suicide: Depression screen [facility] 2/9 08/22/2016  Decreased Interest 0  Down, Depressed, Hopeless 0  PHQ - 2 Score 0   No flowsheet data found. Vision:needed,  Dental:up to date Advanced Directive: Advanced Directives 08/22/2016  Does patient have an advance directive? No  Would patient like information on creating an advanced directive? -   Medications and allergies reviewed with patient and updated if appropriate.  Patient Active Problem List   Diagnosis Date Noted  . Visual impairment 08/22/2016  . Postcoital bleeding 12/29/2011    No current outpatient prescriptions on file prior to visit.   No current facility-administered medications on  file prior to visit.     Past Medical History:  Diagnosis Date  . Allergy    HX OF SEASONAL ALLERGIES  . Chlamydia    06/2010  . LGSIL (low grade squamous intraepithelial lesion) on Pap smear 06/17/2010   with high risk HPV.  01/2011 pap was normal    History reviewed. No pertinent surgical history.  Social History   Social History  . Marital status: Single    Spouse name: N/A  . Number of children: 0  . Years of education: N/A   Occupational History  . Production designer, theatre/television/filmrestaurant supervisor at [employer]     Social History Main Topics  . Smoking status: Never Smoker  . Smokeless tobacco: Never Used  . Alcohol use Yes     Comment: occassionally  . Drug use:     Types: Marijuana     Comment: once a month  . Sexual activity: Yes    Partners: Male    Birth control/ protection: Implant   Other Topics Concern  . None   Social History Narrative   Lives alone, no pets.  [place]    Family History  Problem Relation Age of Onset  . Arthritis Mother   . Hypertension Mother   . Alcohol abuse Father   . Asthma Brother     and allergies  . Arthritis Maternal Grandmother   . Cancer Maternal Grandmother   . Diabetes Maternal Grandfather   . Hypertension Maternal Grandfather   . Alcohol abuse Paternal Grandfather         Review of Systems  Constitutional: Negative for fever, malaise/fatigue and weight  loss.  HENT: Negative for congestion and sore throat.   Eyes: Positive for blurred vision, photophobia and pain. Negative for double vision, discharge, redness and itching.       Negative for visual changes  Respiratory: Negative for cough and shortness of breath.   Cardiovascular: Negative for chest pain, palpitations and leg swelling.  Gastrointestinal: Negative for blood in stool, constipation, diarrhea, heartburn, nausea and vomiting.  Genitourinary: Negative for dysuria, frequency and urgency.  Musculoskeletal: Positive for joint pain. Negative for falls and  myalgias.       Chronic right knee pain, evaluated by [facility] orthopedics 02/2016. Treated with meloxicam x 63month, no improvement. Normal knee x-ray. She plans to return to specialist soon.  Skin: Negative for rash.  Neurological: Negative for dizziness, tingling, sensory change and headaches.  Endo/Heme/Allergies: Does not bruise/bleed easily.  Psychiatric/Behavioral: Negative for depression, substance abuse and suicidal ideas. The patient is not nervous/anxious.     Objective:  There were no vitals filed for this visit.  There is no height or weight on file to calculate BMI.   Physical Examination:  Physical Exam  Constitutional: She is oriented to person, place, and time and well-developed, well-nourished, and in no distress. No distress.  HENT:  Right Ear: Tympanic membrane, external ear and ear canal normal.  Left Ear: Tympanic membrane, external ear and ear canal normal.  Nose: Mucosal edema present. No sinus tenderness. Right sinus exhibits no maxillary sinus tenderness and no frontal sinus tenderness. Left sinus exhibits no maxillary sinus tenderness and no frontal sinus tenderness.  Mouth/Throat: Uvula is midline and mucous membranes are normal. Posterior oropharyngeal erythema present. No oropharyngeal exudate.  Eyes: Conjunctivae and EOM are normal. Pupils are equal, round, and reactive to light. No scleral icterus.  Neck: Normal range of motion. Neck supple. No thyromegaly present.  Cardiovascular: Normal rate, normal heart sounds and intact distal pulses.   Pulmonary/Chest: Effort normal and breath sounds normal. She exhibits no tenderness.  Abdominal: Soft. Bowel sounds are normal. She exhibits no distension. There is no tenderness.  Musculoskeletal: Normal range of motion. She exhibits no edema or tenderness.  Lymphadenopathy:    She has no cervical adenopathy.  Neurological: She is alert and oriented to person, place, and time. Gait normal.  Skin: Skin is warm and  dry.  Psychiatric: Affect and judgment normal.    ASSESSMENT and PLAN:  Maureen Waters was seen today for annual exam.  Diagnoses and all orders for this visit:  Preventative health care -     CBC w/Diff; Future -     Comprehensive metabolic panel; Future -     TSH; Future -     Lipid panel; Future  Visual impairment -     Ambulatory referral to Ophthalmology  Chronic seasonal allergic rhinitis, unspecified trigger  Need for prophylactic vaccination and inoculation against influenza -     Flu Vaccine QUAD 36+ mos IM   No problem-specific Assessment & Plan notes found for this encounter.     Follow up: Return in about 1 year (around 08/22/2017) for CPE.  Alysia Penna, NP

## 2016-11-07 ENCOUNTER — Encounter: Payer: Self-pay | Admitting: Nurse Practitioner

## 2016-12-15 ENCOUNTER — Other Ambulatory Visit (HOSPITAL_COMMUNITY): Payer: Self-pay | Admitting: Orthopedic Surgery

## 2016-12-15 DIAGNOSIS — M25561 Pain in right knee: Secondary | ICD-10-CM

## 2016-12-26 ENCOUNTER — Ambulatory Visit (HOSPITAL_COMMUNITY): Payer: 59

## 2018-04-02 ENCOUNTER — Telehealth (HOSPITAL_COMMUNITY): Payer: Self-pay | Admitting: Professional

## 2018-04-03 ENCOUNTER — Other Ambulatory Visit (HOSPITAL_COMMUNITY): Payer: 59 | Attending: Psychiatry | Admitting: Licensed Clinical Social Worker

## 2018-04-03 DIAGNOSIS — R45851 Suicidal ideations: Secondary | ICD-10-CM | POA: Insufficient documentation

## 2018-04-03 DIAGNOSIS — Z8249 Family history of ischemic heart disease and other diseases of the circulatory system: Secondary | ICD-10-CM | POA: Diagnosis not present

## 2018-04-03 DIAGNOSIS — F332 Major depressive disorder, recurrent severe without psychotic features: Secondary | ICD-10-CM | POA: Diagnosis not present

## 2018-04-03 DIAGNOSIS — F419 Anxiety disorder, unspecified: Secondary | ICD-10-CM | POA: Diagnosis present

## 2018-04-03 DIAGNOSIS — F1911 Other psychoactive substance abuse, in remission: Secondary | ICD-10-CM | POA: Insufficient documentation

## 2018-04-03 DIAGNOSIS — Z818 Family history of other mental and behavioral disorders: Secondary | ICD-10-CM | POA: Diagnosis not present

## 2018-04-04 NOTE — Psych (Signed)
Comprehensive Clinical Assessment (CCA) Note  04/04/2018 Maureen Waters [id number]  Visit Diagnosis:      ICD-10-CM   1. MDD (major depressive disorder), recurrent severe, without psychosis (HCC) F33.2       CCA Part One  Part One has been completed on paper by the patient.  (See scanned document in Chart Review)  CCA Part Two A  Intake/Chief Complaint:  CCA Intake With Chief Complaint CCA Part Two Date: 04/03/18 CCA Part Two Time: 1430 Chief Complaint/Presenting Problem: Pt presents to PHP for CCA. Pt reports suffering from depression and anxiety for years but has not received treatment. Pt reports grandmother passed away 2 years ago and she still has not handled the grief. Pt reports symptoms have become much worse lately and are interfering with her ability to function. Pt reports struggles with work and completing tasks that "used to be easy." Pt shares she finds it hard to concentrate, lacks motivation, feels hopeless, worthless, and finds herself isolating "and I used to be called the 'social butterfly' of the family." Pt reports she is having passive SI; "I just don't want to wake up and that scares me."  Pt states she has a lot of regrets about not completing college and tends to ruminate. Pt reports she is struggling with finances and "getting my life on track." Pt denies HI/AVH.  Patients Currently Reported Symptoms/Problems: increased depression and anxiety; passive SI; crying; mood swings; irritability; hopelessness, worthlessness, sleep issues; lack of motivation; lack of concentration; isolation; racing thoughts; work problems; anhedonia; obsessive thoughts Individual's Strengths: Pt reports she has a supportive family system and boyfriend Individual's Abilities: Motivation for treatment Type of Services Patient Feels Are Needed: Pt reports she wants to gain skills to use when her symptoms increase "so I can keep living my life."  Mental Health Symptoms Depression:   Depression: Change in energy/activity, Difficulty Concentrating, Fatigue, Hopelessness, Irritability, Sleep (too much or little), Tearfulness, Worthlessness  Mania:     Anxiety:      Psychosis:     Trauma:     Obsessions:     Compulsions:     Inattention:     Hyperactivity/Impulsivity:     Oppositional/Defiant Behaviors:     Borderline Personality:     Other Mood/Personality Symptoms:      Mental Status Exam Appearance and self-care  Stature:  Stature: Average  Weight:  Weight: Average weight  Clothing:  Clothing: Casual  Grooming:  Grooming: Normal  Cosmetic use:  Cosmetic Use: Age appropriate  Posture/gait:  Posture/Gait: Normal  Motor activity:  Motor Activity: Not Remarkable  Sensorium  Attention:  Attention: Normal  Concentration:  Concentration: Variable  Orientation:  Orientation: X5  Recall/memory:  Recall/Memory: Normal  Affect and Mood  Affect:  Affect: Depressed, Tearful  Mood:  Mood: Depressed  Relating  Eye contact:  Eye Contact: Fleeting  Facial expression:  Facial Expression: Depressed  Attitude toward examiner:  Attitude Toward Examiner: Cooperative  Thought and Language  Speech flow: Speech Flow: Normal  Thought content:  Thought Content: Appropriate to mood and circumstances  Preoccupation:     Hallucinations:     Organization:     Company secretary of Knowledge:  Fund of Knowledge: Average  Intelligence:  Intelligence: Average  Abstraction:  Abstraction: Normal  Judgement:  Judgement: Fair  Dance movement psychotherapist:  Reality Testing: Adequate  Insight:  Insight: Fair  Decision Making:  Decision Making: Paralyzed  Social Functioning  Social Maturity:  Social Maturity: Isolates, Responsible  Social Judgement:  Social Judgement: Normal  Stress  Stressors:  Stressors: Veterinary surgeon, Arts administrator, Work  Coping Ability:  Coping Ability: Horticulturist, commercial Deficits:     Supports:      Family and Psychosocial History: Family history Marital status:  Single Are you sexually active?: Yes Has your sexual activity been affected by drugs, alcohol, medication, or emotional stress?: decrease Does patient have children?: No  Childhood History:  Childhood History By whom was/is the patient raised?: Mother/father and step-parent Additional childhood history information: Pt reports she was raised mainly by her mother and step-father that she considers her father; Pt shares biological father came in and out of life Description of patient's relationship with caregiver when they were a child: Pt reports a good relationship with mother and step-father; strained with biological father Patient's description of current relationship with people who raised him/her: Pt reports mother and stepfather are supportive; limited contact with bio-father How were you disciplined when you got in trouble as a child/adolescent?: "whoopings, talkings, grounding; nothing overboard" Does patient have siblings?: Yes Number of Siblings: 3 Description of patient's current relationship with siblings: Pt has 2 younger step-brothers and 1 younger brother; pt reports she is close with all Did patient suffer any verbal/emotional/physical/sexual abuse as a child?: No Did patient suffer from severe childhood neglect?: No Has patient ever been sexually abused/assaulted/raped as an adolescent or adult?: No Was the patient ever a victim of a crime or a disaster?: No Witnessed domestic violence?: No Has patient been effected by domestic violence as an adult?: No  CCA Part Two B  Employment/Work Situation: Employment / Work Psychologist, occupational Employment situation: Employed Where is patient currently employed?: [employer] How long has patient been employed?: 26yrs 62mo Patient's job has been impacted by current illness: Yes Describe how patient's job has been impacted: Pt reports she is unable to go to work some days and is unable to function to complete tasks when at work Did [place] Receive Any  Psychiatric Treatment/Services While in Equities trader?: No Are There Guns or Other Weapons in Your Home?: No  Education: Education Did Garment/textile technologist From [employer]?: Yes Did Theme park manager?: Yes Did You Have An Individualized Education Program (IIEP): No Did You Have Any Difficulty At [employer]?: Yes("didn't apply myself") Were Any Medications Ever Prescribed For These Difficulties?: No  Religion: Religion/Spirituality Are You A Religious Person?: Yes What is Your Religious Affiliation?: Christian How Might This Affect Treatment?: "it wont"  Leisure/Recreation: Leisure / Recreation Leisure and Hobbies: dance  Exercise/Diet: Exercise/Diet Do You Exercise?: Yes What Type of Exercise Do You Do?: Dance How Many Times a Week Do You Exercise?: 1-3 times a week Have You Gained or Lost A Significant Amount of Weight in the Past Six Months?: Yes-Gained Number of Pounds Gained: 20 Do You Follow a Special Diet?: No Do You Have Any Trouble Sleeping?: Yes Explanation of Sleeping Difficulties: over sleeping  CCA Part Two C  Alcohol/Drug Use: Alcohol / Drug Use Pain Medications: pt denies Prescriptions: pt denies Over the Counter: pt denies History of alcohol / drug use?: No history of alcohol / drug abuse                      CCA Part Three  ASAM's:  Six Dimensions of Multidimensional Assessment  Dimension 1:  Acute Intoxication and/or Withdrawal Potential:     Dimension 2:  Biomedical Conditions and Complications:     Dimension 3:  Emotional, Behavioral, or Cognitive Conditions and Complications:  Dimension 4:  Readiness to Change:     Dimension 5:  Relapse, Continued use, or Continued Problem Potential:     Dimension 6:  Recovery/Living Environment:      Substance use Disorder (SUD)    Social Function:  Social Functioning Social Maturity: Isolates, Responsible Social Judgement: Normal  Stress:  Stress Stressors: Grief/losses, Arts administrator, Work Coping Ability:  Exhausted Patient Takes Medications The Way The Doctor Instructed?: NA Priority Risk: Moderate Risk  Risk Assessment- Self-Harm Potential: Risk Assessment For Self-Harm Potential Thoughts of Self-Harm: Vague current thoughts Method: No plan Availability of Means: No access/NA Additional Information for Self-Harm Potential: Previous Attempts Additional Comments for Self-Harm Potential: Pt reports she attempted suicide by "swallowing a lot of pills" when 32yo. Pt reports brother found her before she could finish swallowing pills  Risk Assessment -Dangerous to Others Potential: Risk Assessment For Dangerous to Others Potential Method: No Plan  DSM5 Diagnoses: Patient Active Problem List   Diagnosis Date Noted  . Visual impairment 08/22/2016  . Postcoital bleeding 12/29/2011    Patient Centered Plan: Patient is on the following Treatment Plan(s):  Depression  Recommendations for Services/Supports/Treatments: Recommendations for Services/Supports/Treatments Recommendations For Services/Supports/Treatments: Partial Hospitalization(Pt wants to learn coping skills to manage symptoms. Pt needs medication management. Pt is having passive SI and has hx of previous attempt.)  Treatment Plan Summary:  Pt reports "I want to learn how to manage so I can keep living my life. I don't know what to do right now and I feel like I'm falling backwards."  Referrals to Alternative Service(s): Referred to Alternative Service(s):   Place:   Date:   Time:    Referred to Alternative Service(s):   Place:   Date:   Time:    Referred to Alternative Service(s):   Place:   Date:   Time:    Referred to Alternative Service(s):   Place:   Date:   Time:     Wadie Mattie J Koya Hunger, LPCA, LCASA

## 2018-04-05 ENCOUNTER — Other Ambulatory Visit: Payer: Self-pay

## 2018-04-05 ENCOUNTER — Other Ambulatory Visit (HOSPITAL_COMMUNITY): Payer: 59 | Admitting: Occupational Therapy

## 2018-04-05 ENCOUNTER — Encounter (HOSPITAL_COMMUNITY): Payer: Self-pay

## 2018-04-05 ENCOUNTER — Other Ambulatory Visit (HOSPITAL_COMMUNITY): Payer: 59 | Admitting: Licensed Clinical Social Worker

## 2018-04-05 DIAGNOSIS — R4589 Other symptoms and signs involving emotional state: Secondary | ICD-10-CM

## 2018-04-05 DIAGNOSIS — F332 Major depressive disorder, recurrent severe without psychotic features: Secondary | ICD-10-CM

## 2018-04-05 DIAGNOSIS — F079 Unspecified personality and behavioral disorder due to known physiological condition: Secondary | ICD-10-CM

## 2018-04-05 NOTE — H&P (Signed)
Behavioral Health Partial Program Assessment Note  Date: 04/05/2018 Name: Maureen Waters MRN: [id number]  Chief Complaint: Depression and anxiety   Subjective: Maureen Waters 32 year old african [place] female present with worsening depression and  anxiety.  Patient denies that she is followed by psychiatry or therapist in the past.  Patient reports more recently that she she has been unable to concentrate and focus on daily tasks.  Feeling helpless reports passive suicidal ideations. "  If I didn't wake-up it wouldn't be so bad" denies history of suicidal attempts or history of self-injurious behaviors.  Patient reports multiple stressors stressors.  States she is not happy working for [employer], however reports she needs to income. continues to report grief surrounding her grandmother's passing.  [name] denies a history of physical or sexual abuse in the past.  Denies previous inpatient admissions. denies daily medications denies.  substance abuse history states she drinks occasionally socially.  Reported family history of undiagnosed depression/ anxeity.  Patient to consider initiating antidepressant however [name] has declined initiation of medication during this assessment.  We will follow-up with weekly evaluations.  Encouragement,  Support, reassurance was provided patient admitted to partial hospitalization program.   HPI: Patient is a 32 y.o. African American female presents with depression and anxiety .  Patient was enrolled in partial psychiatric program on 04/05/18.  Primary complaints include: anxiety, depression worse, feeling depressed, increased irritability and stressed at work.  Onset of symptoms was gradual with gradually worsening course since that time. Psychosocial Stressors include the following: family and financial.   I have reviewed the following documentation dated 04/05/2018, past psychiatric history was denied, past medical history and past social and family  history  Complaints of Pain: none Past Psychiatric History:  First psychiatric contact   Currently in treatment with   Substance Abuse History: alcohol Use of Alcohol: occasional, social use monthly use Use of Caffeine:  Use of over the counter:   History reviewed. No pertinent surgical history.  Past Medical History:  Diagnosis Date  . Allergy    HX OF SEASONAL ALLERGIES  . Chlamydia    06/2010  . LGSIL (low grade squamous intraepithelial lesion) on Pap smear 06/17/2010   with high risk HPV.  01/2011 pap was normal   Outpatient Encounter Medications as of 04/05/2018  Medication Sig  . etonogestrel (NEXPLANON) 68 MG IMPL implant 1 each by Subdermal route once.  . Multiple Vitamin (MULTIVITAMIN) capsule Take 1 capsule by mouth daily.   No facility-administered encounter medications on file as of 04/05/2018.    Allergies  Allergen Reactions  . Peanut-Containing Drug Products     Shortness of breath     Social History   Tobacco Use  . Smoking status: Never Smoker  . Smokeless tobacco: Never Used  Substance Use Topics  . Alcohol use: Yes    Comment: occassionally   Functioning Relationships: good support system Education: some college Other Pertinent History: Financial Family History  Problem Relation Age of Onset  . Arthritis Mother   . Hypertension Mother   . Alcohol abuse Father   . Asthma Brother        and allergies  . Arthritis Maternal Grandmother   . Cancer Maternal Grandmother   . Diabetes Maternal Grandfather   . Hypertension Maternal Grandfather   . Alcohol abuse Paternal Grandfather      Review of Systems Constitutional: negative  Objective:  There were no vitals filed for this visit.  Physical Exam:   Mental Status Exam: Appearance:  Well groomed Psychomotor::  Within Normal Limits Attention span and concentration: Normal Behavior: calm, cooperative and adequate rapport can be established Speech:  normal pitch Mood:  depressed and  anxious Affect:  normal and mood-congruent Thought Process:  Coherent Thought Content:  Hallucinations: None Orientation:  person, place and time/date Cognition:  grossly intact Insight:  Fair Judgment:  Fair Estimate of Intelligence: Average Fund of knowledge: Aware of current events Memory: Recent and remote intact Abnormal movements: None Gait and station: Normal  Assessment:  Diagnosis: MDD (major depressive disorder), recurrent severe, without psychosis (HCC) [F33.2] 1. MDD (major depressive disorder), recurrent severe, without psychosis (HCC)       Plan: patient enrolled in Partial Hospitalization Program Occupational  Therapy OT orders placed  Patient to consider trial of Zoloft for depression patient is currently declining medication management at this time.    Treatment options and alternatives reviewed with patient and patient understands the above plan.  Treatment plan was reviewed and  discussed agreed upon by NP Chilton Greathouse and patient De Nurse need for group session    Oneta Rack, NP

## 2018-04-05 NOTE — Therapy (Signed)
[facility] PARTIAL HOSPITALIZATION PROGRAM [address] [place], [place], [address] Phone: [phone]   Fax:  [phone]  Occupational Therapy Evaluation  Patient Details  Name: Maureen Waters MRN: [id number] Date of Birth: 11-Jan-1986 Referring Provider: Hillery Jacks, NP   Encounter Date: 04/05/2018  OT End of Session - 04/05/18 0916    Visit Number  1    Number of Visits  16    Date for OT Re-Evaluation  05/03/18    OT Start Time  1030    OT Stop Time  1200    OT Time Calculation (min)  90 min    Activity Tolerance  Patient tolerated treatment well    Behavior During Therapy  [facility] for tasks assessed/performed       Past Medical History:  Diagnosis Date  . Allergy    HX OF SEASONAL ALLERGIES  . Chlamydia    06/2010  . LGSIL (low grade squamous intraepithelial lesion) on Pap smear 06/17/2010   with high risk HPV.  01/2011 pap was normal    History reviewed. No pertinent surgical history.  There were no vitals filed for this visit.  Subjective Assessment - 04/05/18 0912    Currently in Pain?  No/denies        [facility] OT Assessment - 04/05/18 0001      Assessment   Medical Diagnosis  Major Depressive disorder    Referring Provider  Hillery Jacks, NP    Onset Date/Surgical Date  04/05/18      Balance Screen   Has the patient fallen in the past 6 months  No        OT assessment Diagnosis: Pt with diagnosis of MDD  Past medical history/referral information: No remarkable PMH, pt provided from outside source, no previous hospitalizations  Living situation: Pt currently lives by herself, boyfriend travels  ADLs/IADL: PT is having difficulty having motivation to complete IADL, difficulty getting engaging in work.  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  Struggles: Pt reports her job as her biggest challenge, and feels she has not been as social with her friends   OT goal: "To pay off my debt" OCAIRS Mental Health Interview Summary of Client Scores:  FACILITATES PARTICIPATION IN OCCUPATION  ALLOWS PARTICIPATION IN OCCUPATION INHIBITS PARTICIPATION IN OCCUPATION RESTRICTS PARTICIPATION IN OCCUPATION COMMENTS  ROLES                 x     HABITS                 x     PERSONAL CAUSATION                 x     VALUES                 x     INTERESTS                 x     SKILLS                 x     SHORT TERM GOALS             x    LONG TERM GOALS  x    INTERPETATION OF PAST EXPERIENCES                 x     PHYSICAL ENVIRONMENT                 x     SOCIAL ENVIRONMENT                 x     READINESS FOR CHANGE                 x      Need for Occupational Therapy:  4 Shows positive occupational participation, no need for OT.    x 3 Need for minimal intervention/consultative participation   2 Need for OT intervention indicated to restore/improve participation   1 Need for extensive OT intervention indicated to improve participation.  Referral for follow up services also recommended.   Assessment:  Patient demonstrates behavior that allows participation in occupation.  Patient will benefit from occupational therapy intervention in order to improve time management, financial management, stress management, job readiness skills, social skills, and health management skills in preparation to return to full time community living and to be a productive community member.    Plan:  Patient will participate in skilled occupational therapy sessions individually or in a group setting to improve coping skills, psychosocial skills, and emotional skills required to return to prior level of function. Treatment will be 4-5 times per week for 3 weeks.                  OT  Education - 04/05/18 0913    Education Details  education given on incorporating health and wellness    Person(s) Educated  Patient    Methods  Explanation;Handout    Comprehension  Verbalized understanding       OT Short Term Goals - 04/05/18 0920      OT SHORT TERM GOAL #1   Title  Pt will independently apply financial management skills to increase IADL independence to reintegrate into community dwelling    Time  4    Period  Weeks    Status  New    Target Date  05/03/18      OT SHORT TERM GOAL #2   Title  Pt will independently apply psychosocial skills and coping mechanisms to daily activities in order to function independently and reintegrate into community dwelling    Time  4    Period  Weeks    Status  New      OT SHORT TERM GOAL #3   Title  Pt will be educated of strategies to improve psychosocial skills needed to participate in all daily, work, and leisure activities    Time  4    Period  Weeks    Status  New    Target Date  05/03/18       S: "Good mental and physical well being is feeding your body positives" O: Education provided on health and wellness in the physical and mental aspect and how those connections positively affect the body/mind and the ability to appropriately engage in BADL/IADL/community. Definitions on mental and physical health given. Group discussion given on ways to facilitate positive mental health and physical health per pt current level. Self-esteem and awareness of emotions discussed to help further facilitate well rounded health and wellness. Pt brainstormed ways to increase physical activity per current level and comforts. Vision boards created to help pts positively depict the  future, with goals and traits they find admirable/desirable within the self. Pt instructed to share with other group members thoughts/goals when creating vision boards.  A: Pt presented to group with appropriate affect, sharing and offering examples with other group  members. Pt received education on the importance of a healthy physical and mental connections. Pt participated in group discussion on ways to facilitate a stronger physical and mental connection to better support a balance between body/mind to appropriately engage in community dwelling. 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 Pt created vision board and shared her goals and aspirations for the next year with other group members. Goals were obtainable with positive affirmations. P: Pt provided with education on importance of health and wellness to appropriately engage in community. OT will continue to follow up for successful implementation into daily life         Plan - 04/05/18 0919    Occupational performance deficits (Please refer to evaluation for details):  ADL's;IADL's;Rest and Sleep;Education;Work;Leisure;Social Participation    Rehab Potential  Good    OT Frequency  5x / week    OT Duration  4 weeks    OT Treatment/Interventions  Psychosocial skills training;Coping strategies training community integration    Consulted and Agree with Plan of Care  Patient       Patient will benefit from skilled therapeutic intervention in order to improve the following deficits and impairments:  Decreased coping skills, Decreased psychosocial skills, Other (comment)(decreased ability to engage in BADL and integrate into community)  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 Visual impairment 08/22/2016  . Postcoital bleeding 12/29/2011   Dalphine Handing, MSOT, OTR/L  [place] 04/05/2018, 9:24 AM  [facility] HOSPITALIZATION PROGRAM [address] [place], [place], [address] Phone: [phone]    Fax:  [phone]  Name: Annelise Mccoy MRN: [id number] Date of Birth: [birth year]

## 2018-04-08 ENCOUNTER — Encounter (HOSPITAL_COMMUNITY): Payer: Self-pay | Admitting: Occupational Therapy

## 2018-04-08 ENCOUNTER — Other Ambulatory Visit (HOSPITAL_COMMUNITY): Payer: PRIVATE HEALTH INSURANCE | Attending: Psychiatry | Admitting: Licensed Clinical Social Worker

## 2018-04-08 ENCOUNTER — Other Ambulatory Visit (HOSPITAL_COMMUNITY): Payer: PRIVATE HEALTH INSURANCE | Admitting: Occupational Therapy

## 2018-04-08 DIAGNOSIS — F419 Anxiety disorder, unspecified: Secondary | ICD-10-CM | POA: Insufficient documentation

## 2018-04-08 DIAGNOSIS — F332 Major depressive disorder, recurrent severe without psychotic features: Secondary | ICD-10-CM | POA: Diagnosis present

## 2018-04-08 DIAGNOSIS — R4589 Other symptoms and signs involving emotional state: Secondary | ICD-10-CM

## 2018-04-08 DIAGNOSIS — F079 Unspecified personality and behavioral disorder due to known physiological condition: Secondary | ICD-10-CM

## 2018-04-08 NOTE — Therapy (Signed)
[facility] PARTIAL HOSPITALIZATION PROGRAM [address] [place], [place], [address] Phone: [phone]   Fax:  [phone]  Occupational Therapy Treatment  Patient Details  Name: Maureen Waters MRN: [id number] Date of Birth: 1985-11-27 Referring Provider: Hillery Jacks, NP   Encounter Date: 04/08/2018  OT End of Session - 04/08/18 1203    Visit Number  2    Number of Visits  16    Date for OT Re-Evaluation  05/03/18    Authorization Type  [employer]    OT Start Time  1015    OT Stop Time  1105    OT Time Calculation (min)  50 min    Activity Tolerance  Patient tolerated treatment well    Behavior During Therapy  [name] for tasks assessed/performed       Past Medical History:  Diagnosis Date  . Allergy    HX OF SEASONAL ALLERGIES  . Chlamydia    06/2010  . LGSIL (low grade squamous intraepithelial lesion) on Pap smear 06/17/2010   with high risk HPV.  01/2011 pap was normal    History reviewed. No pertinent surgical history.  There were no vitals filed for this visit.  Subjective Assessment - 04/08/18 1201    Currently in Pain?  No/denies       S: "Self esteem is how much you love yourself"  O: Education given on definition and importance of positive self-esteem in daily life and relationships with focus on using positive self-talk. Further education given on the relationship between self-esteem and mental illness and both high and low self-esteem factors. Worksheet given for pt to identify a positive trait about themselves with each letter of the alphabet to use as reference for future times of need and to gain insight on numerous positive qualities. Additional self esteem worksheet given to identify positive aspects in every area of life (work, leisure, relationships, etc.). Pt encouraged to display worksheets in easily accessible area to serve as visual reminder in daily routine. Pt asked to identify 3 things they love about themselves this date.   A:  Pt was actively engaged and participatory throughout entirety of group this date. Pt completed self-esteem alphabet activity with minimal verbal cues. Pt also provided support to other group members to help brainstorm ideas. Pt completed additional self esteem worksheet. When asked to identify 3 things she likes about herself, pt needed increased time and cueing from other group members to answer. Pt 3 things she likes about herself include: being creative, loyal, and outgoing.  P: Pt provided with self-esteem boosting skills to implement into a variety of daily activities/routines. OT will continue to follow up for successful implementation into daily life.                  OT Education - 04/08/18 1203    Education Details  education given on self esteem and incorporating positive practices into daily routine    Person(s) Educated  Patient    Methods  Explanation;Handout    Comprehension  Verbalized understanding       OT Short Term Goals - 04/05/18 0920      OT SHORT TERM GOAL #1   Title  Pt will independently apply financial management skills to increase IADL independence to reintegrate into community dwelling    Time  4    Period  Weeks    Status  New    Target Date  05/03/18      OT SHORT TERM GOAL #2  Title  Pt will independently apply psychosocial skills and coping mechanisms to daily activities in order to function independently and reintegrate into community dwelling    Time  4    Period  Weeks    Status  New      OT SHORT TERM GOAL #3   Title  Pt will be educated of strategies to improve psychosocial skills needed to participate in all daily, work, and leisure activities    Time  4    Period  Weeks    Status  New    Target Date  05/03/18               Plan - 04/08/18 1204    Occupational performance deficits (Please refer to evaluation for details):  ADL's;IADL's;Rest and Sleep;Education;Work;Leisure;Social Participation       Patient will  benefit from skilled therapeutic intervention in order to improve the following deficits and impairments:  Decreased coping skills, Decreased psychosocial skills, Other (comment)(decreased ability to engage in BADL and integrate into community)  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 Visual impairment 08/22/2016  . Postcoital bleeding 12/29/2011    Dalphine HandingKaylee Michaela Shankel, MSOT, OTR/L  [place] Therma Lasure 04/08/2018, 12:06 PM  [facility] PARTIAL HOSPITALIZATION PROGRAM [address] [place], [place], [address] Phone: [phone]   Fax:  [phone]  Name: Maureen Waters MRN: [id number] Date of Birth: 10/01/1986

## 2018-04-09 ENCOUNTER — Encounter (HOSPITAL_COMMUNITY): Payer: Self-pay | Admitting: Occupational Therapy

## 2018-04-09 ENCOUNTER — Encounter (HOSPITAL_COMMUNITY): Payer: Self-pay | Admitting: Family

## 2018-04-09 ENCOUNTER — Other Ambulatory Visit (HOSPITAL_COMMUNITY): Payer: PRIVATE HEALTH INSURANCE | Admitting: Licensed Clinical Social Worker

## 2018-04-09 ENCOUNTER — Other Ambulatory Visit (HOSPITAL_COMMUNITY): Payer: PRIVATE HEALTH INSURANCE | Admitting: Occupational Therapy

## 2018-04-09 DIAGNOSIS — F332 Major depressive disorder, recurrent severe without psychotic features: Secondary | ICD-10-CM

## 2018-04-09 DIAGNOSIS — R4589 Other symptoms and signs involving emotional state: Secondary | ICD-10-CM

## 2018-04-09 DIAGNOSIS — F079 Unspecified personality and behavioral disorder due to known physiological condition: Secondary | ICD-10-CM

## 2018-04-09 NOTE — Therapy (Signed)
[facility] PARTIAL HOSPITAL[facility] PROGRAM [address] [place], [place], [address] Phone: [phone]   Fax:  [phone]  Occupational Therapy Treatment  Patient Details  Name: Maureen Waters MRN: [id number] Date of Birth: 11/29/1985 Referring Provider: Hillery Jacksanika Lewis, NP   Encounter Date: 04/09/2018  OT End of Session - 04/09/18 1324    Visit Number  3    Number of Visits  16    Date for OT Re-Evaluation  05/03/18    Authorization Type  [employer]    OT Start Time  1045    OT Stop Time  1200    OT Time Calculation (min)  75 min    Activity Tolerance  Patient tolerated treatment well    Behavior During Therapy  [facility] for tasks assessed/performed       Past Medical History:  Diagnosis Date  . Allergy    HX OF SEASONAL ALLERGIES  . Chlamydia    06/2010  . LGSIL (low grade squamous intraepithelial lesion) on Pap smear 06/17/2010   with high risk HPV.  01/2011 pap was normal    History reviewed. No pertinent surgical history.  There were no vitals filed for this visit.  Subjective Assessment - 04/09/18 1323    Currently in Pain?  No/denies        S: "I am confident about choreographing dance"  O: Education given on definition and importance of positive self-esteem in daily life and relationships with focus on using positive self-talk. Self confidence worksheet completed identifying previous positive situations and previous negative situations and how it was handled. Breakdown of past experiences used to apply to future situations to help foster self confidence when engaging BADL/IADL and integrating into the community. Self esteem tree worksheet completed, pt identified positive aspects of partner in group to help increase self esteem within others and within the self. Pt encouraged to display sheet in commonly viewed area (room, mirror, etc.) to help increase self esteem.  ?  A: Pt presented with appropriate affect, engaging with other group members. Pt  completed self confidence worksheet with minimal VC's to identify positive experiences. Pt says she feels less confidence whenever she does not succeed (finishing school, asking for help with mental diagnosis). Pt able to compare this negative situation with the confidence she feels choreographing dance to help reframe feelings of low confidence to positive ones. Pt is going to use visualizations of success to help increase confidence this date. Pt completed self esteem tree exercise, pointing out the positive in other group members. Increased affect noticed after receiving self esteem tree made for her by other group members. Pt states she is going to display the self esteem tree in her room to view frequently when engaging in daily routines.   P: Pt provided with self-esteem boosting skills to implement into a variety of daily activities/routines. OT will continue to follow up for successful implementation into daily life.                 OT Education - 04/09/18 1323    Education Details  continued education given on self esteem and incorporating positive practices into daily routines    Person(s) Educated  Patient    Methods  Explanation;Handout    Comprehension  Verbalized understanding       OT Short Term Goals - 04/05/18 0920      OT SHORT TERM GOAL #1   Title  Pt will independently apply financial management skills to increase IADL independence to  reintegrate into community dwelling    Time  4    Period  Weeks    Status  New    Target Date  05/03/18      OT SHORT TERM GOAL #2   Title  Pt will independently apply psychosocial skills and coping mechanisms to daily activities in order to function independently and reintegrate into community dwelling    Time  4    Period  Weeks    Status  New      OT SHORT TERM GOAL #3   Title  Pt will be educated of strategies to improve psychosocial skills needed to participate in all daily, work, and leisure activities    Time  4     Period  Weeks    Status  New    Target Date  05/03/18               Plan - 04/09/18 1325    Occupational performance deficits (Please refer to evaluation for details):  ADL's;IADL's;Rest and Sleep;Education;Work;Leisure;Social Participation       Patient will benefit from skilled therapeutic intervention in order to improve the following deficits and impairments:  Decreased coping skills, Decreased psychosocial skills, Other (comment)(decreased ability to engage in BADL and integrate into community)  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 Visual impairment 08/22/2016  . Postcoital bleeding 12/29/2011   Dalphine Handing, MSOT, OTR/L  [place] 04/09/2018, 1:26 PM  [facility] PARTIAL HOSPITALIZATION PROGRAM [address] [place], [place], [address] Phone: [phone]   Fax:  [phone]  Name: Maureen Waters MRN: [id number] Date of Birth: 23-Feb-1986

## 2018-04-09 NOTE — Psych (Signed)
   [facility] PHP THERAPIST PROGRESS NOTE  Maureen Waters [id number]  Session Time: 9:00 -10:15  Participation Level: Active  Behavioral Response: CasualAlertDepressed  Type of Therapy: Group Therapy; psychotherapy  Treatment Goals addressed: Coping  Interventions: CBT, DBT, Solution Focused, Supportive and Reframing  Summary: Clinician led check-in regarding current stressors and situation, and review of patient completed daily inventory. Clinician utilized active listening and empathetic response and validated patient emotions. Clinician facilitated processing group on pertinent issues.   Therapist Response: Maureen Waters is a 32 y.o. female who presents with depression symptoms. Patient arrived within time allowed and reports that she is feeling "pretty good." Patient rates her mood at a 9 on a scale of 1-10 with 10 being great. Pt reports her weekend went well. Pt reports not making her plans on Saturday due to "not being able to handle people," however rebounding on Sunday and going to church and dance class. Pt reports desire to work on healthy self esteem. Patient engaged in discussion.     Session Time: 10:15 - 11:00   Participation Level: Active   Behavioral Response: CasualAlertDepressed   Type of Therapy: Group Therapy, OT   Treatment Goals addressed: Coping   Interventions: Psychosocial skills training, Supportive,    Summary:  Occupational Therapy group   Therapist Response: Patient engaged in group. See OT note.       Session Time: 11:00 - 12:00   Participation Level: Minimal   Behavioral Response: CasualAlertDepressed   Type of Therapy: Group Therapy, psychoeducation   Treatment Goals addressed: Coping; increasing independence   Interventions: Financial education, Supportive   Summary:  Financial management group: Group is educated on how to manage and maintain basic finances in a smart and healthy manner. Group discussed how effectively managing  finances can help or hinder mental health.     Therapist Response: Patient minimally engaged in group.      Session Time: 12:00 - 12:45  Participation Level: Active  Behavioral Response: CasualAlertDepressed  Type of Therapy: Group Therapy, Activity Therapy  Treatment Goals addressed: Coping  Interventions: Psychologist, occupationalocial Skills Training, Supportive  Summary:  Reflection Group: Patients encouraged to practice skills and interpersonal techniques or work on mindfulness and relaxation techniques. The importance of self-care and making skills part of a routine to increase usage were stressed   Therapist Response: Patient engaged and participated appropriately.       Session Time: 12:45- 2:00  Participation Level: Active  Behavioral Response: CasualAlertDepressed  Type of Therapy: Group Therapy, Psychoeducation; Psychotherapy  Treatment Goals addressed: Coping  Interventions: CBT; Solution focused; Supportive; Reframing  Summary: 12:45 - 1:50: Clinician introduced topic of cognitive distortions. Cln educated on what cognitive distortions are and how they affect us. Cln introduced "Catch, Challenge, Change" and group reviewed cognitive distortion handout and came up with examples to work on "catch." 1:50 -2:00 Clinician led check-out. Clinician assessed for immediate needs, medication compliance and efficacy, and safety concerns   Therapist Response: Patient chose to leave group at 1:10, stating she had "something come up." Pt denies SI/HI when she left.     Suicidal/Homicidal: Nowithout intent/plan   Plan: Pt will continue in PHP while working to decrease depression symptoms and increase ability to manage symptoms herself.  Diagnosis: MDD (major depressive disorder), recurrent severe, without psychosis (HCC) [F33.2]    1. MDD (major depressive disorder), recurrent severe, without psychosis (HCC)       Donia GuilesJenny Vernelle Wisner, LCSW 04/09/2018

## 2018-04-09 NOTE — Progress Notes (Signed)
BH MD/PA/NP  PHP Progress Note  04/09/2018 10:06 AM Gerlene BurdockRaymonda Waters  [facility]:  [id number]   Evaluation: Maureen Waters is awake alert and oriented x3.  Seen attending group session with active and engaged participation.  Patient continues to present flat and guarded but pleasant.   reports low energy and continued isolation from friends and family.  Reports my home is my "safe place."  Rates her depression 5 out of with 10 being the worst on this assessment.  Discussed initiating an antidepressant during this assessment.  Continues to express fleeting passive suicidal thoughts.  " If  I did not wake up it would not be so bad."  Lakenzie able to contract for safety. Denies intent or plan.  patient continues to report apprehension with starting medication as she states "I  don't want to be dependent on medications for the rest of my life."  NP provided patient education with an antidepressant.  NP ordered TSH level pending results.  Reports a fair appetite.  States she is resting well.  Support encouragement reassurance was provided.   History:Per [facility] assessment note- Pt reports suffering from depression and anxiety for years but has not received treatment. Pt reports grandmother passed away 2 years ago and she still has not handled the grief. Pt reports symptoms have become much worse lately and are interfering with her ability to function. Pt reports struggles with work and completing tasks that "used to be easy." Pt shares she finds it hard to concentrate, lacks motivation, feels hopeless, worthless, and finds herself isolating "and I used to be called the 'social butterfly' of the family." Pt reports she is having passive SI; "I just don't want to wake up and that scares me."  Pt states she has a lot of regrets about not completing college and tends to ruminate. Pt reports she is struggling with finances and "getting my life on track." Pt denies HI/AVH.   Visit Diagnosis:    ICD-10-CM   1. MDD (major depressive  disorder), recurrent severe, without psychosis (HCC) F33.2     Past Psychiatric History:   Past Medical History:  Past Medical History:  Diagnosis Date  . Allergy    HX OF SEASONAL ALLERGIES  . Chlamydia    06/2010  . LGSIL (low grade squamous intraepithelial lesion) on Pap smear 06/17/2010   with high risk HPV.  01/2011 pap was normal   History reviewed. No pertinent surgical history.  Family Psychiatric History:   Family History:  Family History  Problem Relation Age of Onset  . Arthritis Mother   . Hypertension Mother   . Alcohol abuse Father   . Asthma Brother        and allergies  . Arthritis Maternal Grandmother   . Cancer Maternal Grandmother   . Diabetes Maternal Grandfather   . Hypertension Maternal Grandfather   . Alcohol abuse Paternal Grandfather     Social History:  Social History   Socioeconomic History  . Marital status: Single    Spouse name: Not on file  . Number of children: 0  . Years of education: Not on file  . Highest education level: Not on file  Occupational History  . Occupation: Production designer, theatre/television/filmrestaurant supervisor at [employer]   Social Needs  . Financial resource strain: Not on file  . Food insecurity:    Worry: Not on file    Inability: Not on file  . Transportation needs:    Medical: Not on file    Non-medical: Not on file  Tobacco  Use  . Smoking status: Never Smoker  . Smokeless tobacco: Never Used  Substance and Sexual Activity  . Alcohol use: Yes    Comment: occassionally  . Drug use: Yes    Types: Marijuana    Comment: once a month  . Sexual activity: Yes    Partners: Male    Birth control/protection: Implant  Lifestyle  . Physical activity:    Days per week: Not on file    Minutes per session: Not on file  . Stress: Not on file  Relationships  . Social connections:    Talks on phone: Not on file    Gets together: Not on file    Attends religious service: Not on file    Active member of club or organization: Not on file     Attends meetings of clubs or organizations: Not on file    Relationship status: Not on file  Other Topics Concern  . Not on file  Social History Narrative   Lives alone, no pets.  Family lives in [place]    Allergies:  Allergies  Allergen Reactions  . Peanut-Containing Drug Products     Shortness of breath     Metabolic Disorder Labs: No results found for: HGBA1C, MPG No results found for: PROLACTIN Lab Results  Component Value Date   CHOL 119 12/29/2011   TRIG 29 12/29/2011   HDL 61 12/29/2011   CHOLHDL 2.0 12/29/2011   VLDL 6 12/29/2011   LDLCALC 52 12/29/2011   No results found for: TSH  Therapeutic Level Labs: No results found for: LITHIUM No results found for: VALPROATE No components found for:  CBMZ  Current Medications: Current Outpatient Medications  Medication Sig Dispense Refill  . etonogestrel (NEXPLANON) 68 MG IMPL implant 1 each by Subdermal route once.    . Multiple Vitamin (MULTIVITAMIN) capsule Take 1 capsule by mouth daily.     No current facility-administered medications for this visit.      Musculoskeletal: Strength & Muscle Tone: within normal limits Gait & Station: normal Patient leans: N/A  Psychiatric Specialty Exam: ROS  There were no vitals taken for this visit.There is no height or weight on file to calculate BMI.  General Appearance: Casual pleasant, cooperative  Eye Contact:  Good  Speech:  Clear and Coherent  Volume:  Normal  Mood:  Depressed  Affect:  Depressed and Flat  Thought Process:  Linear  Orientation:  Full (Time, Place, and Person)  Thought Content: Rumination   Suicidal Thoughts:   passive suicidal ideations.  Able to contract for  for safety  Homicidal Thoughts:  No  Memory:  Immediate;   Fair Recent;   Fair Remote;   Fair  Judgement:  Fair  Insight:  Present  Psychomotor Activity:  Normal  Concentration:  Concentration: Fair  Recall:  [employer] of Knowledge: Fair  Language: Fair  Akathisia:  No   Handed:  Right  AIMS (if indicated)  Assets:  Communication Skills Desire for Improvement Resilience Social Support  ADL's:  Intact  Cognition: WNL  Sleep:  Fair   Screenings: GAD-7     Counselor from 04/05/2018 in BEHAVIORAL HEALTH PARTIAL HOSPITALIZATION PROGRAM  Total GAD-7 Score  17    PHQ2-9     Counselor from 04/05/2018 in BEHAVIORAL HEALTH PARTIAL HOSPITALIZATION PROGRAM Office Visit from 08/22/2016 in [facility]  PHQ-2 Total Score  4  0  PHQ-9 Total Score  20  -       Assessment and  Plan:  Continue partial hospitalization program (PHP) -Orders placed for TSH  Patient to consider starting antidepressant  Treatment plan reviewed and agreed upon by NP T. Melvyn Neth and patient Terriah Reggio for continued group services  Oneta Rack, NP 04/09/2018, 10:06 AM

## 2018-04-09 NOTE — Psych (Signed)
   [facility] THERAPIST PROGRESS [facility]  Maureen Waters Novak [id number]  Session Time: 9:00 -10:15  Participation Level: Active  Behavioral Response: CasualAlertDepressed  Type of Therapy: Group Therapy; psychotherapy  Treatment Goals addressed: Coping  Interventions: CBT, DBT, Solution Focused, Supportive and Reframing  Summary: Clinician led check-in regarding current stressors and situation, and review of patient completed daily inventory. Clinician utilized active listening and empathetic response and validated patient emotions. Clinician facilitated processing group on pertinent issues.   Therapist Response: Maureen Waters Dlugosz is a 32 y.o. female who presents with depression symptoms. Patient arrived within time allowed and reports that she is feeling "blah." Patient rates her mood at a 5 on a scale of 1-10 with 10 being great. Pt reports she spent yesterday "not doing much" and struggled to participate in her normal activities. Pt states she left all responsibilities early due to depression symptoms. Patient reports difficulty with energy and motivation. Patient engaged in discussion.     Session Time: 10:15 - 11:00  Participation Level: Active  Behavioral Response: CasualAlertDepressed  Type of Therapy: Group Therapy, Psychotherapy  Treatment Goals addressed: Coping  Interventions: CBT, Solution focused, Supportive, Reframing  Summary:  Clinician continued topic of distress tolerance skills and introduced Self Soothe skill. Group members discussed ways they would utilize self soothe in their everyday life.      Therapist Response: Pt reports understanding of self soothe skills and reports the touch sense is most effective for her.      Session Time: 11:00 -12:00   Participation Level: Active   Behavioral Response: CasualAlertDepressed   Type of Therapy: Group Therapy, OT   Treatment Goals addressed: Coping   Interventions: Psychosocial skills training, Supportive,     Summary:  Occupational Therapy group   Therapist Response: Patient engaged in group. See OT note.         Session Time: 12:00- 1:00  Participation Level: Active  Behavioral Response: CasualAlertDepressed  Type of Therapy: Group Therapy, Psychoeducation; Psychotherapy  Treatment Goals addressed: Coping  Interventions: CBT; Solution focused; Supportive; Reframing  Summary: 12:00 - 12:50 Group watched "100 days of Rejection" TedTalk and discussed the topic of rejection and how that plays out in our lives. 12:50 -1:00 Clinician led check-out. Clinician assessed for immediate needs, medication compliance and efficacy, and safety concerns   Therapist Response:  Pt engaged on discussion regarding rejection. Pt is able to identify ways in which the fear of rejection has held her back.  At check-out, patient rates her mood at a 7.5 on a scale of 1-10 with 10 being great. Patient reports weekend plans of spending time in church, with family, and seeing her best friend.  Patient demonstrates some progress as evidenced by engaging in first group session. Patient denies SI/HI/self-harm at the end of group.      Suicidal/Homicidal: Nowithout intent/plan   Plan: Pt will continue in PHP while working to decrease depression symptoms and increase ability to manage symptoms herself.  Diagnosis: MDD (major depressive disorder), recurrent severe, without psychosis (HCC) [F33.2]    1. MDD (major depressive disorder), recurrent severe, without psychosis (HCC)       Donia GuilesJenny Mykelti Goldenstein, LCSW 04/09/2018

## 2018-04-10 ENCOUNTER — Other Ambulatory Visit (HOSPITAL_COMMUNITY): Payer: Self-pay

## 2018-04-10 ENCOUNTER — Telehealth (HOSPITAL_COMMUNITY): Payer: Self-pay | Admitting: Professional

## 2018-04-10 NOTE — Psych (Signed)
   [facility]  Maureen Waters Blasing [id number]  Session Time: 9:00 -11:00  Participation Level: Active  Behavioral Response: CasualAlertDepressed  Type of Therapy: Group Therapy; psychotherapy  Treatment Goals addressed: Coping  Interventions: CBT, DBT, Solution Focused, Supportive and Reframing  Summary: Clinician led check-in regarding current stressors and situation, and review of patient completed daily inventory. Clinician utilized active listening and empathetic response and validated patient emotions. Clinician facilitated processing group on pertinent issues.   Therapist Response: Maureen Waters Wedekind is a 32 y.o. female who presents with depression symptoms. Patient arrived within time allowed and reports that she is feeling "okay." Patient rates her mood at a 8 on a scale of 1-10 with 10 being great. Pt reports she is in a "good mood, but still thinking bad." Pt shares struggles with being around others when she is feeling low and feeling like she has to wear a mask. Pt is able to  processes through these feelings in group. Patient engaged in discussion.     Session Time: 11:00 - 12:15   Participation Level: Active   Behavioral Response: CasualAlertDepressed   Type of Therapy: Group Therapy, OT   Treatment Goals addressed: Coping   Interventions: Psychosocial skills training, Supportive,    Summary:  Occupational Therapy group   Therapist Response: Patient engaged in group. See OT note.         Session Time: 12:15 - 1:00  Participation Level: Active  Behavioral Response: CasualAlertDepressed  Type of Therapy: Group Therapy, Activity Therapy  Treatment Goals addressed: Coping  Interventions: Psychologist, occupationalocial Skills Training, Supportive  Summary:  Reflection Group: Patients encouraged to practice skills and interpersonal techniques or work on mindfulness and relaxation techniques. The importance of self-care and making skills part of a routine to  increase usage were stressed   Therapist Response: Patient engaged and participated appropriately.       Session Time: 1:00- 2:00  Participation Level: Active  Behavioral Response: CasualAlertDepressed  Type of Therapy: Group Therapy, Psychoeducation; Psychotherapy  Treatment Goals addressed: Coping  Interventions: CBT; Solution focused; Supportive; Reframing  Summary: 1:00 - 1:50: Cln continued topic of cognitive distortions. Group discussed how to "challenge" the unhealthy thought patterns once recognized. Group reviewed "Challenges to Negative Thinking" handout and went over handout with irrational thought examples and discussed how to challenge those thoughts. 1:50 -2:00 Clinician led check-out. Clinician assessed for immediate needs, medication compliance and efficacy, and safety concerns   Therapist Response: Patient engaged in activity. Pt states understanding of how to challenge unhealthy thoughts and successfully reframed examples in discussion. At check-out, patient rates her mood at a 10 on a scale of 1-10 with 10 being great. 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 Patient demonstrates some progress as evidenced by sharing on a deeper level. Patient denies SI/HI/self-harm at the end of group.     Suicidal/Homicidal: Nowithout intent/plan   Plan: Pt will continue in PHP while working to decrease depression symptoms and increase ability to manage symptoms herself.  Diagnosis: MDD (major depressive disorder), recurrent severe, without psychosis (HCC) [F33.2]    1. MDD (major depressive disorder), recurrent severe, without psychosis (HCC)       Donia GuilesJenny Charleston Hankin, LCSW 04/10/2018

## 2018-04-11 ENCOUNTER — Other Ambulatory Visit (HOSPITAL_COMMUNITY): Payer: PRIVATE HEALTH INSURANCE | Admitting: Occupational Therapy

## 2018-04-11 ENCOUNTER — Encounter (HOSPITAL_COMMUNITY): Payer: Self-pay | Admitting: Occupational Therapy

## 2018-04-11 ENCOUNTER — Encounter (HOSPITAL_COMMUNITY): Payer: Self-pay

## 2018-04-11 ENCOUNTER — Other Ambulatory Visit (HOSPITAL_COMMUNITY): Payer: PRIVATE HEALTH INSURANCE | Admitting: Licensed Clinical Social Worker

## 2018-04-11 VITALS — BP 104/62 | HR 75 | Ht 67.0 in | Wt 172.0 lb

## 2018-04-11 DIAGNOSIS — F332 Major depressive disorder, recurrent severe without psychotic features: Secondary | ICD-10-CM

## 2018-04-11 DIAGNOSIS — F079 Unspecified personality and behavioral disorder due to known physiological condition: Secondary | ICD-10-CM

## 2018-04-11 DIAGNOSIS — R4589 Other symptoms and signs involving emotional state: Secondary | ICD-10-CM

## 2018-04-11 NOTE — Progress Notes (Signed)
Met with patient who presents with appropriate affect, depressed mood and admitted to finally seeking some help for depression she reports has worsened over the past 2 years since loosing a grandmother.  Patient reported she has 4 younger brothers and a lot of friends who have always depended on her to be the strong person in their lives so she has always managed her emotions on her own.  Patient reported finally acknowledging she may need some help and stated liking PHP so far as she is learning skills to help with managing boundaries and depressive symptoms.  Patient denied any suicidal or homicidal ideations, no auditory or visual hallucinations and no plan or intent to harm self or others.  States she has gained a little weight over the past few years and is in a healthy relationship.  States she has had some problems with sleeping too much with depression and discussed possible medications to help with depressive symptoms.  Patient currently interested in doing PHP and therapy to manage depression but is thinking about medication as an option too.  She scored a 20 on her initial PHQ9 depression screening 04/05/18 and will continue to monitor progress with weekly contacts.  Patient at this time reported feeling some better and agreed to contact this nurse or PHP staff if any worsening of symptoms or thoughts returned to want to harm self or others as denies as an issue or concern presently.

## 2018-04-11 NOTE — Therapy (Signed)
[facility] PARTIAL HOSPITALIZATION PROGRAM [address] [place], [place], [address] Phone: [phone]   Fax:  [phone]  Occupational Therapy Treatment  Patient Details  Name: Maureen Waters MRN: [id number] Date of Birth: 1985/12/07 Referring Provider: Hillery Jacks, NP   Encounter Date: 04/11/2018  OT End of Session - 04/11/18 1442    Visit Number  4    Number of Visits  16    Date for OT Re-Evaluation  05/03/18    Authorization Type  [employer]    OT Start Time  1100    OT Stop Time  1200    OT Time Calculation (min)  60 min    Activity Tolerance  Patient tolerated treatment well    Behavior During Therapy  [facility] for tasks assessed/performed       Past Medical History:  Diagnosis Date  . Allergy    HX OF SEASONAL ALLERGIES  . Chlamydia    06/2010  . LGSIL (low grade squamous intraepithelial lesion) on Pap smear 06/17/2010   with high risk HPV.  01/2011 pap was normal    History reviewed. No pertinent surgical history.  There were no vitals filed for this visit.  Subjective Assessment - 04/11/18 1441    Currently in Pain?  No/denies      S: "I was raised to always be assertive and stand up for yourself"  O: Education and activities given in reference to increase assertiveness skills within daily life and relationships. Assertiveness quiz given to increase insight on pt current skills and how to improve based on given score. Further education given on assertive conversation, situations, body language, and appropriate context for skill. Worksheet given to identify three definitions (assertive, passive, and aggressive) with opportunity for role play between 2 participants in to promote assertiveness training in a variety of social settings (individuals in community and health care providers). Pt asked to identify one area to increase assertiveness this date for successful integration into community.  A: Pt was actively engaged throughout entirety of  group this date. Pt completed assertiveness quiz showing she is naturally assertive in all situations, not needing much outside practice- pt in agreeance with this score. Pt completed assertiveness training worksheet, helping to appropriately share insights to other group members on her assertiveness skills. Pt actively participated in role playing activity to practice being assertive to a random community member and being an aggressive healthcare provider. In reference to last activity, identified goal of continuing assertiveness practice in all situations. She states she has most difficulty being assertive with people who are aggressive towards her, for she wants to be aggressive back.  P: Pt provided with assertiveness skills to implement into a variety of daily social situations. OT will continue to follow up with communication skills for successful implementation into daily life.                      OT Education - 04/11/18 1441    Education Details  education given on assertiveness skills for successful community reintegration    Person(s) Educated  Patient    Methods  Explanation;Handout    Comprehension  Verbalized understanding       OT Short Term Goals - 04/05/18 0920      OT SHORT TERM GOAL #1   Title  Pt will independently apply financial management skills to increase IADL independence to reintegrate into community dwelling    Time  4    Period  Weeks  Status  New    Target Date  05/03/18      OT SHORT TERM GOAL #2   Title  Pt will independently apply psychosocial skills and coping mechanisms to daily activities in order to function independently and reintegrate into community dwelling    Time  4    Period  Weeks    Status  New      OT SHORT TERM GOAL #3   Title  Pt will be educated of strategies to improve psychosocial skills needed to participate in all daily, work, and leisure activities    Time  4    Period  Weeks    Status  New    Target Date   05/03/18               Plan - 04/11/18 1442    Occupational performance deficits (Please refer to evaluation for details):  ADL's;IADL's;Rest and Sleep;Education;Work;Leisure;Social Participation       Patient will benefit from skilled therapeutic intervention in order to improve the following deficits and impairments:  Decreased coping skills, Decreased psychosocial skills, Other (comment)(decreased ability to engage in BADL and integrate into comunity)  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 Visual impairment 08/22/2016  . Postcoital bleeding 12/29/2011   Dalphine HandingKaylee Weston Kallman, MSOT, OTR/L  [place]lee Lorimer Tiberio 04/11/2018, 2:48 PM  [facility] PART[facility] HOSPITAL[facility] PROGRAM [address] [place], [place], [address] Phone: [phone]   Fax:  [phone]  Name: Maureen Waters MRN: [id number] Date of Birth: 04/23/1986

## 2018-04-12 ENCOUNTER — Other Ambulatory Visit (HOSPITAL_COMMUNITY): Payer: PRIVATE HEALTH INSURANCE | Admitting: Licensed Clinical Social Worker

## 2018-04-12 ENCOUNTER — Other Ambulatory Visit (HOSPITAL_COMMUNITY): Payer: PRIVATE HEALTH INSURANCE | Admitting: Occupational Therapy

## 2018-04-12 ENCOUNTER — Encounter (HOSPITAL_COMMUNITY): Payer: Self-pay | Admitting: Occupational Therapy

## 2018-04-12 DIAGNOSIS — F332 Major depressive disorder, recurrent severe without psychotic features: Secondary | ICD-10-CM | POA: Diagnosis not present

## 2018-04-12 DIAGNOSIS — F079 Unspecified personality and behavioral disorder due to known physiological condition: Secondary | ICD-10-CM

## 2018-04-12 DIAGNOSIS — R4589 Other symptoms and signs involving emotional state: Secondary | ICD-10-CM

## 2018-04-12 NOTE — Therapy (Signed)
[facility] PART[facility]GRAM [address] [place], [place], [address] Phone: [phone]   Fax:  [phone]  Occupational Therapy Treatment  Patient Details  Name: Maureen BurdockRaymonda Waters MRN: [id number] Date of Birth: 08/17/1986 Referring Provider: Hillery Jacksanika Lewis, NP   Encounter Date: 04/12/2018  OT End of Session - 04/12/18 1310    Visit Number  4    Number of Visits  16    Date for OT Re-Evaluation  05/03/18    Authorization Type  [employer]    OT Start Time  1100    OT Stop Time  1200    OT Time Calculation (min)  60 min    Activity Tolerance  Patient tolerated treatment well    Behavior During Therapy  [facility] for tasks assessed/performed       Past Medical History:  Diagnosis Date  . Allergy    HX OF SEASONAL ALLERGIES  . Chlamydia    06/2010  . LGSIL (low grade squamous intraepithelial lesion) on Pap smear 06/17/2010   with high risk HPV.  01/2011 pap was normal    History reviewed. No pertinent surgical history.  There were no vitals filed for this visit.  Subjective Assessment - 04/12/18 1310    Currently in Pain?  No/denies                           OT Education - 04/12/18 1310    Education Details  eduation given on communication skills for successful community reintegration    Person(s) Educated  Patient    Methods  Explanation;Handout    Comprehension  Verbalized understanding       OT Short Term Goals - 04/05/18 0920      OT SHORT TERM GOAL #1   Title  Pt will independently apply financial management skills to increase IADL independence to reintegrate into community dwelling    Time  4    Period  Weeks    Status  New    Target Date  05/03/18      OT SHORT TERM GOAL #2   Title  Pt will independently apply psychosocial skills and coping mechanisms to daily activities in order to function independently and reintegrate into community dwelling    Time  4    Period  Weeks    Status  New      OT SHORT  TERM GOAL #3   Title  Pt will be educated of strategies to improve psychosocial skills needed to participate in all daily, work, and leisure activities    Time  4    Period  Weeks    Status  New    Target Date  05/03/18         S: "I believe communication depends on the situation"   O: Communication skills group completed with emphasis on control, influence and acceptance in regards to social scenarios. The control, influence, acceptance (CIA) framework was provided as a communication skill to help address uncomfortable or "elephant in the room" social situations. Pt was to write out 2-3 uncomfortable social situations to analyze within group. This exercise offered an opportunity for self-reflection in order to relieve stress and uncertainty when using appropriate communication skills/strategies when reintegrating into the community and daily routines.   A: Pt completed communication activity with peers. Pt actively engaged in communication skill worksheets this date. Pt identified 3 uncomfortable social situations with focus on ex boyfriend situations, race, and finances. Pt  identified she is ultimately only in control of how she expresses her emotions, and is not in control of others thoughts/feelings. Pt was able to accurately identify areas of control vs. areas not within personal control after presented with CIA framework. Pt expressed improved communication skill this date to carry over into community integration (work, other daily routines).   P: Pt provided with communication skills in the area of the CIA framework to implement into daily social situations when reintegrating into the community. OT will continue follow up with communication skills for successful implementation in daily life.          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 Visual impairment 08/22/2016  . Postcoital bleeding 12/29/2011    Maureen Waters, MSOT, OTR/L  [place] 04/12/2018, 1:16 PM  [facility] HOSPITALIZATION PROGRAM [address] [place], [place], [address] Phone: [phone]   Fax:  [phone]  Name: Maureen Waters MRN: [id number] Date of Birth: 02-May-1986

## 2018-04-12 NOTE — Psych (Signed)
   [facility]  Maureen BurdockRaymonda Vanduyn [id number]  Session Time: 9:00 -11:00  Participation Level: Active  Behavioral Response: CasualAlertDepressed  Type of Therapy: Group Therapy; psychotherapy  Treatment Goals addressed: Coping  Interventions: CBT, DBT, Solution Focused, Supportive and Reframing  Summary: Clinician led check-in regarding current stressors and situation, and review of patient completed daily inventory. Clinician utilized active listening and empathetic response and validated patient emotions. Clinician facilitated processing group on pertinent issues.   Therapist Response: Maureen Waters is a 32 y.o. female who presents with depression symptoms. Patient arrived within time allowed and reports that she is feeling "pretty good." Patient rates her mood at a 8 on a scale of 1-10 with 10 being great. Pt reports she did not make it to rehearsal yesterday and still feels like it is a struggle to meet responsibilities when it involves leaving the house.  Pt is able to determine action steps in processing. Patient engaged in discussion.      Session Time: 11:00 -12:15   Participation Level: Active   Behavioral Response: CasualAlertDepressed   Type of Therapy: Group Therapy, OT   Treatment Goals addressed: Coping   Interventions: Psychosocial skills training, Supportive,    Summary:  Occupational Therapy group   Therapist Response: Patient engaged in group. See OT note.           Session Time: 12:15 - 1:00  Participation Level: Active  Behavioral Response: CasualAlertDepressed  Type of Therapy: Group Therapy, Activity Therapy  Treatment Goals addressed: Coping  Interventions: Psychologist, occupationalocial Skills Training, Supportive  Summary:  Reflection Group: Patients encouraged to practice skills and interpersonal techniques or work on mindfulness and relaxation techniques. The importance of self-care and making skills part of a routine to increase  usage were stressed   Therapist Response: Patient engaged and participated.      Session Time: 1:00 - 2:00   Participation Level: Active   Behavioral Response: CasualAlertDepressed   Type of Therapy: Group Therapy, Psychotherapy; Psychoeducation   Treatment Goals addressed: Coping   Interventions: CBT, Solution focused, Supportive, Reframing   Summary: 1:00 - 1:50 Cln introduced topic of boundaries. Cln provided psychoeducation on what boundaries are, porous, rigid and healthy boundary characteristics, and the different types of boundaries. Group related the information to themselves to begin discovering potential boundary issues.  1:50 -2:00 Clinician led check-out. Clinician assessed for immediate needs, medication compliance and efficacy, and safety concerns      Therapist Response: Patient engaged in group. Pt reports understanding of boundaries and shares she has mainly rigid boundaries.  At check-out, patient rates her mood at a 8 on a scale of 1-10 with 10 being great. Pt reports afternoon plans of doing her mom's hair and trying to attend dance rehearsals. Patient demonstrates some progress as evidenced by coming up with a baby step to achieve goal of getting out of the house. Patient denies SI/HI/self-harm thoughts at the end of group.      Suicidal/Homicidal: Nowithout intent/plan   Plan: Pt will continue in PHP while working to decrease depression symptoms and increase ability to manage symptoms herself.  Diagnosis: MDD (major depressive disorder), recurrent severe, without psychosis (HCC) [F33.2]    1. MDD (major depressive disorder), recurrent severe, without psychosis (HCC)       Donia GuilesJenny Raymie Giammarco, LCSW 04/12/2018

## 2018-04-15 ENCOUNTER — Other Ambulatory Visit (HOSPITAL_COMMUNITY): Payer: PRIVATE HEALTH INSURANCE | Admitting: Licensed Clinical Social Worker

## 2018-04-15 ENCOUNTER — Other Ambulatory Visit (HOSPITAL_COMMUNITY): Payer: PRIVATE HEALTH INSURANCE | Admitting: Occupational Therapy

## 2018-04-15 ENCOUNTER — Encounter (HOSPITAL_COMMUNITY): Payer: Self-pay | Admitting: Occupational Therapy

## 2018-04-15 DIAGNOSIS — F332 Major depressive disorder, recurrent severe without psychotic features: Secondary | ICD-10-CM

## 2018-04-15 DIAGNOSIS — F079 Unspecified personality and behavioral disorder due to known physiological condition: Secondary | ICD-10-CM

## 2018-04-15 DIAGNOSIS — R4589 Other symptoms and signs involving emotional state: Secondary | ICD-10-CM

## 2018-04-15 NOTE — Therapy (Signed)
[facility] PARTIAL HOSPITALIZATION PROGRAM [address] [place], [place], [address] Phone: [phone]   Fax:  [phone]  Occupational Therapy Treatment  Patient Details  Name: Maureen Waters MRN: [id number] Date of Birth: 1986/06/17 Referring Provider: Hillery Jacks, NP   Encounter Date: 04/15/2018  OT End of Session - 04/15/18 1346    Visit Number  5    Number of Visits  16    Date for OT Re-Evaluation  05/03/18    Authorization Type  [employer]    OT Start Time  1100    OT Stop Time  1200    OT Time Calculation (min)  60 min    Activity Tolerance  Patient tolerated treatment well    Behavior During Therapy  [facility] for tasks assessed/performed       Past Medical History:  Diagnosis Date  . Allergy    HX OF SEASONAL ALLERGIES  . Chlamydia    06/2010  . LGSIL (low grade squamous intraepithelial lesion) on Pap smear 06/17/2010   with high risk HPV.  01/2011 pap was normal    History reviewed. No pertinent surgical history.  There were no vitals filed for this visit.  Subjective Assessment - 04/15/18 1345    Currently in Pain?  No/denies        S: "I am only accountable when I want to be/know things need to get done"  O: Pt educated on the definition and 3 types of self-accountability (your actions/responsibilities, responsibilities, and goals) with verbal instruction and encouragement to give personal examples. Pt asked to rate current accountability level 1-10 (10 being great, 1 being poor). Education further given on self-accountability being in line with personal values and goals to maintain occupational balance in various community settings. Pt given goal identifying worksheet to list immediate, short term, medium term, and long-term goals using a SMART goal framework (specificity, meaningful, adaptive, realistic, and time bound). Goals created as guideline for pt to practice being accountable in various situations. Pt completed work sheet of goals  and encouraged to share goals with the group, with emphasis on immediate goal for check in with pt for next session to maintain accountability.   A: Pt engaged in verbal discussion of definitions of self-accountability with personal examples, with appropriate affect and sharing with facilitator and other group members. Pt completed goals work sheet using SMART goal framework, while remaining in line with values for promotion of occupational balance to help foster continuous practice of accountability skills. Pt identified immediate goal of "making phone calls to family and friends she has been putting off" and long term goal of returning to school to finish degree.  P: Pt provided with education on improving accountability. OT will continue to follow up with communication skills for successful implementation into daily life.                     OT Education - 04/15/18 1345    Education Details  educaiton given on accountability skills in relation to goal setting to implement into community reintegration    Person(s) Educated  Patient    Methods  Explanation;Handout    Comprehension  Verbalized understanding       OT Short Term Goals - 04/05/18 0920      OT SHORT TERM GOAL #1   Title  Pt will independently apply financial management skills to increase IADL independence to reintegrate into community dwelling    Time  4    Period  Weeks    Status  New    Target Date  05/03/18      OT SHORT TERM GOAL #2   Title  Pt will independently apply psychosocial skills and coping mechanisms to daily activities in order to function independently and reintegrate into community dwelling    Time  4    Period  Weeks    Status  New      OT SHORT TERM GOAL #3   Title  Pt will be educated of strategies to improve psychosocial skills needed to participate in all daily, work, and leisure activities    Time  4    Period  Weeks    Status  New    Target Date  05/03/18                Plan - 04/15/18 1346    Occupational performance deficits (Please refer to evaluation for details):  ADL's;IADL's;Rest and Sleep;Education;Work;Leisure;Social Participation       Patient will benefit from skilled therapeutic intervention in order to improve the following deficits and impairments:  Decreased coping skills, Decreased psychosocial skills, Other (comment)(decreased ability to engage in BADL and integrate into community)  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 Visual impairment 08/22/2016  . Postcoital bleeding 12/29/2011   Dalphine HandingKaylee Anniston Nellums, MSOT, OTR/L  DonaldsKaylee Avantika Shere 04/15/2018, 1:49 PM  [facility] PART[facility] PROGRAM [address] [place], [place], [id number] Phone: [phone]   Fax:  [phone]  Name: Maureen Waters MRN: [id number] Date of Birth: 06/25/1986

## 2018-04-15 NOTE — Psych (Signed)
   [facility]  Maureen Waters [id number]  Session Time: 9:00 -10:15  Participation Level: Active  Behavioral Response: CasualAlertDepressed  Type of Therapy: Group Therapy; psychotherapy  Treatment Goals addressed: Coping  Interventions: CBT, DBT, Solution Focused, Supportive and Reframing  Summary: Clinician led check-in regarding current stressors and situation, and review of patient completed daily inventory. Clinician utilized active listening and empathetic response and validated patient emotions. Clinician facilitated processing group on pertinent issues.   Therapist Response: Maureen Waters is a 32 y.o. female who presents with depression symptoms. Patient arrived within time allowed and reports that she is feeling "pretty good." Patient rates her mood at a 9on a scale of 1-10 with 10 being great. 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 Pt shares she is engaging in the dance rehearsals, but will go for 15 minutes when she is needed and leave again. Pt states she continues to not feel able to stay focused and level enough to be around people longer than that. Pt shares her brother reached out to check on her and that boosted her mood. Patient engaged in discussion.      Session Time: 10:15 -11:00  Participation Level: Active  Behavioral Response: CasualAlertDepressed  Type of Therapy: Group Therapy, psychoeducation, psychotherapy  Treatment Goals addressed: Coping  Interventions: CBT, DBT, Solution Focused, Supportive and Reframing  Summary:  Clinician continued discussion on boundaries. Clinician reviewed information discussed yesterday. Group discussed the different ways boundaries can present including material, time, physical, and emotional and examples of each presentation.  Therapist Response: Patient participated and reports understanding of the different ways boundaries can be seen.     Session Time: 11:00 -12:15    Participation Level: Active   Behavioral Response: CasualAlertDepressed   Type of Therapy: Group Therapy, OT   Treatment Goals addressed: Coping   Interventions: Psychosocial skills training, Supportive,    Summary:  Occupational Therapy group   Therapist Response: Patient engaged in group. See OT note.         Session Time: 12:00- 1:00  Participation Level: Active  Behavioral Response: CasualAlertDepressed  Type of Therapy: Group Therapy, Psychoeducation; Psychotherapy  Treatment Goals addressed: Coping  Interventions: CBT; Solution focused; Supportive; Reframing  Summary: 12:00 - 12:50 Clinician provided education on how to set boundaries. Cln utilized handout "How to set a Boundary" and "Healthy Boundary setting." Pt's shared boundary issues and group discussed how to handle them.  12:50 -1:00 Clinician led check-out. Clinician assessed for immediate needs, medication compliance and efficacy, and safety concerns   Therapist Response: Patient engaged activity and discussion. Pt reports understanding of how to set a boundary and was able to walk through a personal example with the group. At check-out, patient rates her mood at a 10 on a scale of 1-10 with 10 being great. Patient reports weekend plans of seeing her boyfriend and making "appearances" at graduation celebrations. Pt brainstormed ways to make quick exits should she feel overwhelmed. Patient demonstrates some progress as evidenced by increased insight into concerns. Patient denies SI/HI/self-harm at the end of group.   Suicidal/Homicidal: Nowithout intent/plan   Plan: Pt will continue in PHP while working to decrease depression symptoms and increase ability to manage symptoms herself.  Diagnosis: MDD (major depressive disorder), recurrent severe, without psychosis (HCC) [F33.2]    1. MDD (major depressive disorder), recurrent severe, without psychosis (HCC)       Donia GuilesJenny Jerrilynn Mikowski, LCSW 04/15/2018

## 2018-04-16 ENCOUNTER — Other Ambulatory Visit (HOSPITAL_COMMUNITY): Payer: PRIVATE HEALTH INSURANCE | Admitting: Occupational Therapy

## 2018-04-16 ENCOUNTER — Encounter (HOSPITAL_COMMUNITY): Payer: Self-pay | Admitting: Occupational Therapy

## 2018-04-16 ENCOUNTER — Other Ambulatory Visit (HOSPITAL_COMMUNITY): Payer: PRIVATE HEALTH INSURANCE | Admitting: Licensed Clinical Social Worker

## 2018-04-16 DIAGNOSIS — F079 Unspecified personality and behavioral disorder due to known physiological condition: Secondary | ICD-10-CM

## 2018-04-16 DIAGNOSIS — F332 Major depressive disorder, recurrent severe without psychotic features: Secondary | ICD-10-CM

## 2018-04-16 DIAGNOSIS — R4589 Other symptoms and signs involving emotional state: Secondary | ICD-10-CM

## 2018-04-16 MED ORDER — TRAZODONE HCL 50 MG PO TABS: 50.0000 mg | ORAL_TABLET | Freq: Every day | ORAL | 0 refills | Status: DC

## 2018-04-16 NOTE — Psych (Addendum)
   [facility]  Maureen Waters [id number]  Session Time: 9:00 -11:00  Participation Level: Active  Behavioral Response: CasualAlertDepressed  Type of Therapy: Group Therapy; psychotherapy  Treatment Goals addressed: Coping  Interventions: CBT, DBT, Solution Focused, Supportive and Reframing  Summary: Clinician led check-in regarding current stressors and situation, and review of patient completed daily inventory. Clinician utilized active listening and empathetic response and validated patient emotions. Clinician facilitated processing group on pertinent issues.   Therapist Response: Maureen BurdockRaymonda Tena is a 32 y.o. female who presents with depression symptoms. Patient arrived late and reports that she is feeling "pretty good." Patient rates her mood at a 8 on a scale of 1-10 with 10 being great. Pt reports her weekend was good "overall" however she felt overwhelmed at the number of social events she had scheduled. Pt continues to struggle with pushing herself. Patient engaged in discussion.      Session Time: 11:00 -12:00   Participation Level: Active   Behavioral Response: CasualAlertDepressed   Type of Therapy: Group Therapy, OT   Treatment Goals addressed: Coping   Interventions: Psychosocial skills training, Supportive,    Summary:  Occupational Therapy group   Therapist Response: Patient engaged in group. See OT note.            Session Time: 12:00 - 12:45  Participation Level: Active  Behavioral Response: CasualAlertDepressed  Type of Therapy: Group Therapy, Activity Therapy  Treatment Goals addressed: Coping  Interventions: Psychologist, occupationalocial Skills Training, Supportive  Summary:  Reflection Group: Patients encouraged to practice skills and interpersonal techniques or work on mindfulness and relaxation techniques. The importance of self-care and making skills part of a routine to increase usage were stressed   Therapist Response: Patient  engaged and participated.       Session Time: 12:45- 2:00  Participation Level: Active  Behavioral Response: CasualAlertDepressed  Type of Therapy: Group Therapy, Psychoeducation; Psychotherapy  Treatment Goals addressed: Coping  Interventions: CBT; Solution focused; Supportive; Reframing  Summary: 12:45 - 1:50: : Clinician led group on The Five Love Languages and how they can aid relationships. Group members discussed the importance of each language and took the [employer] quiz. Cln discussed how looking at love languages can counteract unhealthy thought patterns and improve interpersonal relationships.1:50 -2:00 Clinician led check-out. Clinician assessed for immediate needs, medication compliance and efficacy, and safety concerns   Therapist Response: Patient engaged in activity and discussion. Patient identified her love language as quality time and states she feels her and her partner are responsive to each other's needs.  At check-out, patient rates her mood at a 10 on a scale of 1-10 with 10 being great. Pt reports afternoon plans of seeing her mom, running errands, and attending rehearsals. Pt demonstrates some progress as evidenced by reporting increased ability to be around people. Pt denies SI/HI/self-harm at the end of group.    Suicidal/Homicidal: Nowithout intent/plan   Plan: Pt will continue in PHP while working to decrease depression symptoms and increase ability to manage symptoms herself.  Diagnosis: MDD (major depressive disorder), recurrent severe, without psychosis (HCC) [F33.2]    1. MDD (major depressive disorder), recurrent severe, without psychosis (HCC)       Donia GuilesJenny Sandra Tellefsen, LCSW 04/16/2018

## 2018-04-16 NOTE — Progress Notes (Deleted)
BH MD/PA/NP OP Progress Note  04/16/2018 1:26 PM Maureen Waters  MRN:  [id number]  Chief Complaint:  HPI: *** Visit Diagnosis: No diagnosis found.  Past Psychiatric History: ***  Past Medical History:  Past Medical History:  Diagnosis Date  . Allergy    HX OF SEASONAL ALLERGIES  . Chlamydia    06/2010  . LGSIL (low grade squamous intraepithelial lesion) on Pap smear 06/17/2010   with high risk HPV.  01/2011 pap was normal   No past surgical history on file.  Family Psychiatric History: ***  Family History:  Family History  Problem Relation Age of Onset  . Arthritis Mother   . Hypertension Mother   . Depression Mother   . Alcohol abuse Father   . Asthma Brother        and allergies  . Arthritis Maternal Grandmother   . Cancer Maternal Grandmother   . Diabetes Maternal Grandfather   . Hypertension Maternal Grandfather   . Alcohol abuse Paternal Grandfather     Social History:  Social History   Socioeconomic History  . Marital status: Single    Spouse name: Not on file  . Number of children: 0  . Years of education: Not on file  . Highest education level: Not on file  Occupational History  . Occupation: Production designer, theatre/television/film at [employer]  . Financial resource strain: Somewhat hard  . Food insecurity:    Worry: Never true    Inability: Never true  . Transportation needs:    Medical: No    Non-medical: No  Tobacco Use  . Smoking status: Never Smoker  . Smokeless tobacco: Never Used  Substance and Sexual Activity  . Alcohol use: Yes    Alcohol/week: 0.6 oz    Types: 1 Glasses of wine per week    Comment: glass of wine every couple of weeks   . Drug use: Yes    Types: Marijuana    Comment: twice a month  . Sexual activity: Yes    Partners: Male    Birth control/protection: Implant  Lifestyle  . Physical activity:    Days per week: 3 days    Minutes per session: 120 min  . Stress: Only a little  Relationships  . Social connections:     Talks on phone: More than three times a week    Gets together: More than three times a week    Attends religious service: More than 4 times per year    Active member of club or organization: Yes    Attends meetings of clubs or organizations: More than 4 times per year    Relationship status: Never married  Other Topics Concern  . Not on file  Social History Narrative   Lives alone, no pets.  Family lives in [place]    Allergies:  Allergies  Allergen Reactions  . Peanut-Containing Drug Products     Shortness of breath     Metabolic Disorder Labs: No results found for: HGBA1C, MPG No results found for: PROLACTIN Lab Results  Component Value Date   CHOL 119 12/29/2011   TRIG 29 12/29/2011   HDL 61 12/29/2011   CHOLHDL 2.0 12/29/2011   VLDL 6 12/29/2011   LDLCALC 52 12/29/2011   No results found for: TSH  Therapeutic Level Labs: No results found for: LITHIUM No results found for: VALPROATE No components found for:  CBMZ  Current Medications: Current Outpatient Medications  Medication Sig Dispense Refill  . etonogestrel (NEXPLANON) 68  MG IMPL implant 1 each by Subdermal route once.    . Multiple Vitamin (MULTIVITAMIN) capsule Take 1 capsule by mouth daily.    . naproxen sodium (ALEVE) 220 MG tablet Take 220 mg by mouth daily as needed.    . traZODone (DESYREL) 50 MG tablet Take 1 tablet (50 mg total) by mouth at bedtime. 15 tablet 0   No current facility-administered medications for this visit.      Musculoskeletal: Strength & Muscle Tone: {desc; muscle tone:32375} Gait & Station: {PE GAIT ED ZOXW:96045}ATL:22525} Patient leans: {Patient Leans:21022755}  Psychiatric Specialty Exam: ROS  There were no vitals taken for this visit.There is no height or weight on file to calculate BMI.  General Appearance: {Appearance:22683}  Eye Contact:  {BHH EYE CONTACT:22684}  Speech:  {Speech:22685}  Volume:  {Volume (PAA):22686}  Mood:  {BHH MOOD:22306}  Affect:  {Affect  (PAA):22687}  Thought Process:  {Thought Process (PAA):22688}  Orientation:  {BHH ORIENTATION (PAA):22689}  Thought Content: {Thought Content:22690}   Suicidal Thoughts:  {ST/HT (PAA):22692}  Homicidal Thoughts:  {ST/HT (PAA):22692}  Memory:  {BHH MEMORY:22881}  Judgement:  {Judgement (PAA):22694}  Insight:  {Insight (PAA):22695}  Psychomotor Activity:  {Psychomotor (PAA):22696}  Concentration:  {Concentration:21399}  Recall:  {BHH GOOD/FAIR/POOR:22877}  Fund of Knowledge: {BHH GOOD/FAIR/POOR:22877}  Language: {BHH GOOD/FAIR/POOR:22877}  Akathisia:  {BHH YES OR NO:22294}  Handed:  {Handed:22697}  AIMS (if indicated): {Desc; done/not:10129}  Assets:  {Assets (PAA):22698}  ADL's:  {BHH WUJ'W:11914}ADL'S:22290}  Cognition: {chl bhh cognition:304700322}  Sleep:  {BHH GOOD/FAIR/POOR:22877}   Screenings: GAD-7     Counselor from 04/05/2018 in BEHAVIORAL HEALTH PARTIAL HOSPITALIZATION PROGRAM  Total GAD-7 Score  17    PHQ2-9     Counselor from 04/05/2018 in BEHAVIORAL HEALTH PARTIAL HOSPITALIZATION PROGRAM [facility]  PHQ-2 Total Score  4  0  PHQ-9 Total Score  20  -       Assessment and Plan: Continue PHP Start Trazodone 25 mg po QHS for insomnia     [name], NP 04/16/2018, 1:26 PM

## 2018-04-16 NOTE — Psych (Signed)
   [facility]  Maureen Waters [id number]  Session Time: 9:00 -11:00  Participation Level: Active  Behavioral Response: CasualAlertDepressed  Type of Therapy: Group Therapy; psychotherapy  Treatment Goals addressed: Coping  Interventions: CBT, DBT, Solution Focused, Supportive and Reframing  Summary: Clinician led check-in regarding current stressors and situation, and review of patient completed daily inventory. Clinician utilized active listening and empathetic response and validated patient emotions. Clinician facilitated processing group on pertinent issues.   Therapist Response: Maureen Waters is a 32 y.o. female who presents with depression symptoms. Patient arrived within time allowed and reports that she is feeling "okay." Patient rates her mood at a 7 on a scale of 1-10 with 10 being great. Pt reports she is struggling with accepting her arthritis issues and how they create limits for dance. Pt reports poor sleep and energy. Pt struggles with acceptance. Patient engaged in discussion.      Session Time: 11:00 -12:00   Participation Level: Active   Behavioral Response: CasualAlertDepressed   Type of Therapy: Group Therapy, OT   Treatment Goals addressed: Coping   Interventions: Psychosocial skills training, Supportive,    Summary:  Occupational Therapy group   Therapist Response: Patient engaged in group. See OT note.            Session Time: 12:00 - 12:45  Participation Level: Active  Behavioral Response: CasualAlertDepressed  Type of Therapy: Group Therapy, Activity Therapy  Treatment Goals addressed: Coping  Interventions: Psychologist, occupationalocial Skills Training, Supportive  Summary:  Reflection Group: Patients encouraged to practice skills and interpersonal techniques or work on mindfulness and relaxation techniques. The importance of self-care and making skills part of a routine to increase usage were stressed   Therapist Response:  Patient engaged and participated.       Session Time: 12:45- 2:00  Participation Level: Active  Behavioral Response: CasualAlertDepressed  Type of Therapy: Group Therapy, Psychoeducation; Psychotherapy  Treatment Goals addressed: Coping  Interventions: CBT; Solution focused; Supportive; Reframing  Summary: 12:45 - 1:50:Cln introduced topic of communication. Cln discussed 3 components of communication: nonverbals, words, and listening. Group discussed nonverbals: what they are and how they affect communication.  1:50 -2:00 Clinician led check-out. Clinician assessed for immediate needs, medication compliance and efficacy, and safety concerns   Therapist Response: Patient engaged in group. Pt was able to give examples of nonverbal and demonstrated awareness of how her nonverbals are taken.  At check-out, patient rates her mood at a 9 on a scale of 1-10 with 10 being great. Pt reports afternoon plans of a rehearsal and getting a tattoo. Pt demonstrates some progress as evidenced by processing through acceptance issues. Pt denies SI/HI/self-harm at the end of group.    Suicidal/Homicidal: Nowithout intent/plan   Plan: Pt will continue in PHP while working to decrease depression symptoms and increase ability to manage symptoms herself.  Diagnosis: MDD (major depressive disorder), recurrent severe, without psychosis (HCC) [F33.2]    1. MDD (major depressive disorder), recurrent severe, without psychosis (HCC)       Donia GuilesJenny Dail Meece, LCSW 04/16/2018

## 2018-04-16 NOTE — Therapy (Addendum)
[employer] [place], [place], [address] Phone: [phone]   Fax:  [phone]  Occupational Therapy Treatment  Patient Details  Name: Maureen Waters MRN: [id number] Date of Birth: [birth year] Referring Provider: Ricky Ala, NP   Encounter Date: 04/16/2018    Past Medical History:  Diagnosis Date  . Allergy    HX OF SEASONAL ALLERGIES  . Anxiety   . Chlamydia    06/2010  . Depression   . LGSIL (low grade squamous intraepithelial lesion) on Pap smear 06/17/2010   with high risk HPV.  01/2011 pap was normal    History reviewed. No pertinent surgical history.  There were no vitals filed for this visit.     S: "I just have to control my spending with shopping"  O: Education received on financial management to help increase independence and responsibility in IADLs. Distinction between "want and need" made for better financial boundaries. The five necessities (food, shelter, insurance, taxes, and transportation) identified and discussed. Budget activity completed for pt to practice different ways to manage money. Spending journal issued to pt to track finances for the week. Education given on apps to use for electronic management of finances.   A: Pt actively engaged in OT treatment this date. Pt reports she has "learned the hard way" by making errors and having a difficult time getting by financially. She reports her mom has accounting experience and has been an asset to help her Patent examiner. Pt reports she has to have self discipline and follow a budget or she will spend too frivolously. Pt identified differences between want/need and the five necessities with assistance of group discussion. Pt actively engaged in budgeting activity with success and appropriate communication with partner. Pt received and in understanding of hand outs and to track expenses for the week for later group discussion. Pt  prefers to use apps on her phone to best manage her finances.   P: Pt provided with education on financial management skills to increase IADL independence and responsibility in daily life. OT wil continue to follow up with pt for successful implementation into daily life.                       OT Short Term Goals - 04/18/18 1315      OT SHORT TERM GOAL #1   Title  Pt will independently apply financial management skills to increase IADL independence to reintegrate into community dwelling    Time  4    Period  Weeks    Status  Achieved    Target Date  05/03/18      OT SHORT TERM GOAL #2   Title  Pt will independently apply psychosocial skills and coping mechanisms to daily activities in order to function independently and reintegrate into community dwelling    Time  4    Period  Weeks    Status  Achieved      OT SHORT TERM GOAL #3   Title  Pt will be educated of strategies to improve psychosocial skills needed to participate in all daily, work, and leisure activities    Time  4    Period  Weeks    Status  Achieved                 Patient will benefit from skilled therapeutic intervention in order to improve the following deficits and impairments:  Decreased coping skills, Decreased psychosocial skills,  Other (comment)(decreased ability to engage in BADL and integrate into community)  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 Visual impairment 08/22/2016  . Postcoital bleeding 12/29/2011    OCCUPATIONAL THERAPY DISCHARGE SUMMARY  Visits from Start of Care: 6  Current functional level related to goals / functional outcomes: independent   Remaining deficits: N/A. Pt is stepping down to IOP level of care for continued growth in  coping strategies to apply to daily life.   Education / Equipment: Pt was educated on psychosocial and coping skills to apply to BADL/IADL for successful community reintegration.   Plan: Patient agrees to discharge.  Patient goals were met. Patient is being discharged due to meeting the stated rehab goals.  ?????         Zenovia Jarred, MSOT, OTR/L  [place] 04/18/2018, 1:16 PM  [facility] HOSPITALIZATION PROGRAM [place], [place], [address] Phone: [phone]   Fax:  [phone]  Name: Maureen Waters MRN: [id number] Date of Birth: 08/27/1986

## 2018-04-17 ENCOUNTER — Other Ambulatory Visit (HOSPITAL_COMMUNITY): Payer: PRIVATE HEALTH INSURANCE | Admitting: Licensed Clinical Social Worker

## 2018-04-17 ENCOUNTER — Encounter (HOSPITAL_COMMUNITY): Payer: Self-pay | Admitting: Family

## 2018-04-17 DIAGNOSIS — F332 Major depressive disorder, recurrent severe without psychotic features: Secondary | ICD-10-CM

## 2018-04-17 NOTE — Progress Notes (Signed)
  [facility] Discharge Summary  Maureen Waters [id number]  Admission date: 04/04/2018 Discharge date: 04/17/2018  Reason for admission: Depression  Per assessment note:Maureen Waters 32 year old african [place] female present with worsening depression and  anxiety.  Patient denies that she is followed by psychiatry or therapist in the past.  Patient reports more recently that she she has been unable to concentrate and focus on daily tasks.  Feeling helpless reports passive suicidal ideations. "  If I didn't wake-up it wouldn't be so bad" denies history of suicidal attempts or history of self-injurious behaviors.  Patient reports multiple stressors stressors.  States she is not happy working for [employer], however reports she needs to income. continues to report grief surrounding her grandmother's passing.Maureen Waters denies a history of physical or sexual abuse in the past.  Denies previous inpatient admissions. denies daily medications denies.  substance abuse history states she drinks occasionally socially.  Reported family history of undiagnosed depression/ anxeity.  Patient to consider initiating antidepressant however [place] has declined initiation of medication during this assessment.  We will follow-up with weekly evaluations.  Encouragement,  Support, reassurance was provided patient admitted to partial hospitalization program    Family of Origin Issues: Patient reports support of family members and her boyfriend.  Denies any pressing family stressors during this discharge evaluation.   Progress in Program Toward Treatment Goals: [name] attended and participated during group sessions.  Patient had expressed concerns with difficulty sleeping for the past few months.  Patient continued to be apprehensive regarding initiating medication.  However was evaluated at discharge prescribed  discharge instructions and a prescription for trazodone 50  mg as needed nightly.  Continues to deny suicidal or homicidal ideations.  Support encouragement and reassurance was provided.   Progress (rationale): IOP stepdown.   Initiated trazodone 50 mg p.o. nightly as needed for insomnia   Take all medications as prescribed. Keep all follow-up appointments as scheduled.  Do not consume alcohol or use illegal drugs while on prescription medications. Report any adverse effects from your medications to your primary care provider promptly.  In the event of recurrent symptoms or worsening symptoms, call 911, a crisis hotline, or go to the nearest emergency department for evaluation.   Maureen Rackanika N Dereke Neumann, NP 04/17/2018

## 2018-04-17 NOTE — Psych (Signed)
   [facility] THERAPIST [facility]  Maureen Waters [id number]  Session Time: 9:00 -10:45  Participation Level: Active  Behavioral Response: CasualAlertDepressed  Type of Therapy: Group Therapy; psychotherapy  Treatment Goals addressed: Coping  Interventions: CBT, DBT, Solution Focused, Supportive and Reframing  Summary: Clinician led check-in regarding current stressors and situation, and review of patient completed daily inventory. Clinician utilized active listening and empathetic response and validated patient emotions. Clinician facilitated processing group on pertinent issues.   Therapist Response: Maureen BurdockRaymonda Castro is a 32 y.o. female who presents with depression symptoms. Patient arrived within time allowed and reports that she is feeling "good today." Patient rates her mood at a 10 on a scale of 1-10 with 10 being great. Pt reports having a really positive day yesterday and being excited about the new tattoos she got done. 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  Patient engaged in discussion.      Session Time: 10:45 -12:15  Participation Level: Active  Behavioral Response: CasualAlertDepressed  Type of Therapy: Group Therapy, psychotherapy  Treatment Goals addressed: Coping  Interventions: Strengths based, reframing, Supportive,   Summary:  Spiritual Care group  Therapist Response: Patient engaged in group. See chaplain note.         Session Time: 12:15 - 1:00  Participation Level: Active  Behavioral Response: CasualAlertDepressed  Type of Therapy: Group Therapy, Activity Therapy  Treatment Goals addressed: Coping  Interventions: Psychologist, occupationalocial Skills Training, Supportive  Summary:  Reflection Group: Patients encouraged to practice skills and interpersonal techniques or work on mindfulness and relaxation techniques. The importance of self-care and making skills part of a routine to  increase usage were stressed   Therapist Response: Patient engaged and participated appropriately.       Session Time: 1:00- 2:00  Participation Level: Active  Behavioral Response: CasualAlertDepressed  Type of Therapy: Group Therapy, Psychoeducation, Activity therapy  Treatment Goals addressed: Coping  Interventions: relaxation training; Supportive; Reframing  Summary: 12:45 - 1:50: Relaxation group: Cln led group focused on retraining the body's response to stress.   1:50 -2:00 Clinician led check-out. Clinician assessed for immediate needs, medication compliance and efficacy, and safety concerns   Therapist Response: Patient engaged in activity and discussion. At check-out, patient rates her mood at a 10 on a scale of 1-10 with 10 being great. Patient reports afternoon plans of more dance rehearsals Patient demonstrates some progress as evidenced by increased mood and ability to be around people. Patient denies SI/HI/self-harm thoughts at the end of group.     Suicidal/Homicidal: Nowithout intent/plan   Plan: Pt will discharge from PHP due to meeting treatment goals of decreased depression symptoms and increased ability to manage symptoms. Pt's progress is noted by observation, self report, and scales. Pt and provider are aligned with discharge. Pt will step down to IOP within this agency to continue to work towards managing responsibilities and increasing energy and sleep. Pt will begin IOP on 04/18/18. Pt denies SI/HI/psychosis at time of discharge.     Diagnosis: MDD (major depressive disorder), recurrent severe, without psychosis (HCC) [F33.2]    1. MDD (major depressive disorder), recurrent severe, without psychosis (HCC)       Donia GuilesJenny Yolani Vo, LCSW 04/17/2018

## 2018-04-18 ENCOUNTER — Other Ambulatory Visit (HOSPITAL_COMMUNITY): Payer: Self-pay

## 2018-04-18 ENCOUNTER — Other Ambulatory Visit (HOSPITAL_COMMUNITY): Payer: PRIVATE HEALTH INSURANCE | Admitting: Psychiatry

## 2018-04-18 ENCOUNTER — Ambulatory Visit (HOSPITAL_COMMUNITY): Payer: Self-pay

## 2018-04-18 ENCOUNTER — Encounter (HOSPITAL_COMMUNITY): Payer: Self-pay | Admitting: Psychiatry

## 2018-04-18 DIAGNOSIS — F332 Major depressive disorder, recurrent severe without psychotic features: Secondary | ICD-10-CM

## 2018-04-18 NOTE — Progress Notes (Signed)
Gerlene BurdockRaymonda Greeno is a 32 y.o., single, employed, [place] female; who [facility]. Pt reports suffering from depression and anxiety for years but has not received treatment. Pt reports grandmother passed away 2 years ago and she still has not handled the grief. Pt reports symptoms have become much worse lately and are interfering with her ability to function. Pt reports struggles with work and completing tasks that "used to be easy." Pt shares she finds it hard to concentrate, lacks motivation, feels hopeless, worthless, and finds herself isolating "and I used to be called the 'social butterfly' of the family." Pt reports she is having passive SI; "I just don't want to wake up and that scares me."  Pt states she has a lot of regrets about not completing college and tends to ruminate. Pt reports she is struggling with finances and "getting my life on track." Pt denies HI/AVH.  Reports boyfriend of three years is very supportive. Pt completed all forms and scored 30 on the burns.  A:  Oriented pt.  Provided pt with an orientation folder.  Encouraged support groups.  Will refer pt to Hospice for grief counseling.  R:  Pt receptive.       Jeri Modenaita Jeremey Bascom, M.Ed,CNA

## 2018-04-18 NOTE — Progress Notes (Signed)
    Daily Group Progress Note  Program: IOP  Group Time: 9:00-12:00  Participation Level: Active  Behavioral Response: Appropriate  Type of Therapy:  Group Therapy  Summary of Progress: Pt.'s first day of IOP. Pt. Presented as talkative, appropriately tearful. Pt. Met with case manager for intake assessment. Pt. Discussed grief related to loss of her grandmother, regret about not completing her college education instead of getting a job while she was in college, and destructive thoughts about herself that she attributes to her relationship with her ex-boyfriend. Pt. Participated in discussion about developing realistic self-talk.     Nancie Neas, LPC

## 2018-04-19 ENCOUNTER — Other Ambulatory Visit (HOSPITAL_COMMUNITY): Payer: Self-pay

## 2018-04-19 ENCOUNTER — Other Ambulatory Visit (HOSPITAL_COMMUNITY): Payer: PRIVATE HEALTH INSURANCE | Admitting: Psychiatry

## 2018-04-19 ENCOUNTER — Telehealth (HOSPITAL_COMMUNITY): Payer: Self-pay | Admitting: Psychiatry

## 2018-04-19 ENCOUNTER — Ambulatory Visit (HOSPITAL_COMMUNITY): Payer: Self-pay

## 2018-04-19 ENCOUNTER — Encounter (HOSPITAL_COMMUNITY): Payer: Self-pay | Admitting: Family

## 2018-04-19 DIAGNOSIS — F332 Major depressive disorder, recurrent severe without psychotic features: Secondary | ICD-10-CM | POA: Diagnosis not present

## 2018-04-19 MED ORDER — TRAZODONE HCL 50 MG PO TABS: 50.0000 mg | ORAL_TABLET | Freq: Every day | ORAL | 0 refills | Status: DC

## 2018-04-19 NOTE — Telephone Encounter (Signed)
D:  Pt continues to be struggling with the loss of her Maternal Grandmother two yrs ago.  A:  Contacted [facility] to refer pt for grief counseling.  Left vm.  R:  Pt receptive.

## 2018-04-19 NOTE — Progress Notes (Signed)
Psychiatric Initial Adult Assessment   Patient Identification: Maureen Waters MRN:  [id number] Date of Evaluation:  04/19/2018 Referral Source: PHP step down Chief Complaint:  Depression  Visit Diagnosis:    ICD-10-CM   1. MDD (major depressive disorder), recurrent severe, without psychosis (HCC) F33.2     History of Present Illness:  Maureen Waters 32 year old african [place] Waters present with worsening depression and  anxiety. Patient is stepping down from partial hospitalization program. Information was provided by her assessment note in PHP-  Patient denies that she is followed by psychiatry or therapist in the past.  Patient reports more recently that she she has been unable to concentrate and focus on daily tasks.  Feeling helpless reports passive suicidal ideations. "  If I didn't wake-up it wouldn't be so bad" denies history of suicidal attempts or history of self-injurious behaviors.  Patient reports multiple stressors stressors.  States she is not happy working for [employer], however reports she needs to income. continues to report grief surrounding her grandmother's passing.Maureen Waters denies a history of physical or sexual abuse in the past.  Denies previous inpatient admissions. denies daily medications denies.  substance abuse history states she drinks occasionally socially.  Reported family history of undiagnosed depression/ anxeity.  Patient to consider initiating antidepressant however Maureen Waters has declined initiation of medication during this assessment.  We will follow-up with weekly evaluations.  On evaluation 04/19/2018:  Maureen Waters, Maureen Waters. Continues to expression concerns with insomnia and mild racing thoughts reports to fall asleep. Reports partial hospitalization was helpful with learning new life skills and different coping strategies and techniques.  Patient appears to minimize depression or depressive symptoms.  Although patient has been apprehensive   about  initiating medications in the past.  She was just receptive to starting trazodone for insomnia and her mild racing thoughts.  Patient continues to ruminate "grieving the right way".  Denies suicidal homicidal ideations during this assessment.  Patient  was initiated on trazodone reports taking and tolerating well denies medication side effects.  support encouragement and reassurance was provided.   Associated Signs/Symptoms: Depression Symptoms:  depressed mood, difficulty concentrating, anxiety, (Hypo) Manic Symptoms:  Irritable Mood, Anxiety Symptoms:  Excessive Worry, Psychotic Symptoms:  Hallucinations: None PTSD Symptoms: NA  Past Psychiatric History: was denied  Previous Psychotropic Medications: no  Substance Abuse History in the last 12 months:  No.  Consequences of Substance Abuse: NA  Past Medical History:  Past Medical History:  Diagnosis Date  . Allergy    HX OF SEASONAL ALLERGIES  . Anxiety   . Chlamydia    06/2010  . Depression   . LGSIL (low grade squamous intraepithelial lesion) on Pap smear 06/17/2010   with high risk HPV.  01/2011 pap was normal   No past surgical history on file.  Family Psychiatric History: Mother: depression/anxeity   Family History:  Family History  Problem Relation Age of Onset  . Arthritis Mother   . Hypertension Mother   . Depression Mother   . Alcohol abuse Father   . Asthma Brother        and allergies  . Arthritis Maternal Grandmother   . Cancer Maternal Grandmother   . Diabetes Maternal Grandfather   . Hypertension Maternal Grandfather   . Alcohol abuse Paternal Grandfather     Social History:   Social History   Socioeconomic History  . Marital status: Single    Spouse name: Not on file  . Number of children: 0  .  Years of education: Not on file  . Highest education level: Not on file  Occupational History  . Occupation: Production designer, theatre/television/film at [employer]  . Financial resource strain:  Somewhat hard  . Food insecurity:    Worry: Never true    Inability: Never true  . Transportation needs:    Medical: No    Non-medical: No  Tobacco Use  . Smoking status: Never Smoker  . Smokeless tobacco: Never Used  Substance and Sexual Activity  . Alcohol use: Yes    Alcohol/week: 0.6 oz    Types: 1 Glasses of wine per week    Comment: glass of wine every couple of weeks   . Drug use: Yes    Types: Marijuana    Comment: twice a month  . Sexual activity: Yes    Partners: Male    Birth control/protection: Implant  Lifestyle  . Physical activity:    Days per week: 3 days    Minutes per session: 120 min  . Stress: Only a little  Relationships  . Social connections:    Talks on phone: More than three times a week    Gets together: More than three times a week    Attends religious service: More than 4 times per year    Active member of club or organization: Yes    Attends meetings of clubs or organizations: More than 4 times per year    Relationship status: Never married  Other Topics Concern  . Not on file  Social History Narrative   Lives alone, no pets.  Family lives in [place]    Additional Social History:   Allergies:   Allergies  Allergen Reactions  . Peanut-Containing Drug Products     Shortness of breath     Metabolic Disorder Labs: No results found for: HGBA1C, MPG No results found for: PROLACTIN Lab Results  Component Value Date   CHOL 119 12/29/2011   TRIG 29 12/29/2011   HDL 61 12/29/2011   CHOLHDL 2.0 12/29/2011   VLDL 6 12/29/2011   LDLCALC 52 12/29/2011     Current Medications: Current Outpatient Medications  Medication Sig Dispense Refill  . etonogestrel (NEXPLANON) 68 MG IMPL implant 1 each by Subdermal route once.    . Multiple Vitamin (MULTIVITAMIN) capsule Take 1 capsule by mouth daily.    . naproxen sodium (ALEVE) 220 MG tablet Take 220 mg by mouth daily as needed.    . traZODone (DESYREL) 50 MG tablet Take 1 tablet (50 mg  total) by mouth at bedtime. 15 tablet 0   No current facility-administered medications for this visit.     Neurologic: Headache: No Seizure: No Paresthesias:No  Musculoskeletal: Strength & Muscle Tone: within normal limits Gait & Station: normal Patient leans: N/A  Psychiatric Specialty Exam: ROS  There were no vitals taken for this visit.There is no height or weight on file to calculate BMI.  General Appearance: Casual  Eye Contact:  Good  Speech:  Clear and Coherent  Volume:  Normal  Mood:  Depressed  Affect:  Congruent  Thought Process:  Coherent  Orientation:  Full (Time, Place, and Person)  Thought Content:  Hallucinations: None  Suicidal Thoughts:  No  Homicidal Thoughts:  No  Memory:  Immediate;   Fair Recent;   Fair Remote;   Fair  Judgement:  Fair  Insight:  Fair  Psychomotor Activity:  Normal  Concentration:  Concentration: Fair  Recall:  [employer] of Knowledge:Fair  Language: Fair  Akathisia:  No  Handed:  Right  AIMS (if indicated):    Assets:  Communication Skills Desire for Improvement Social Support  ADL's:  Intact  Cognition: WNL  Sleep:  Patient was started  on trazodone 50 mg PRN while attending PHP     Treatment Plan Summary: Admitted to IOP Medication management :   Continue Trazodone 50 mg Po QHS  for depression and insomnia   Treatment plan was reviewed and agreed upon by NP. T.Lewis and patient Maureen Waters continued need for group services.   Oneta Rack, NP 6/14/20198:30 AM

## 2018-04-22 ENCOUNTER — Other Ambulatory Visit (HOSPITAL_COMMUNITY): Payer: Self-pay

## 2018-04-22 ENCOUNTER — Ambulatory Visit (HOSPITAL_COMMUNITY): Payer: Self-pay

## 2018-04-22 NOTE — Progress Notes (Signed)
    Daily Group Progress Note  Program: IOP  Group Time: 9:00-12:00  Participation Level: Active  Behavioral Response: Appropriate  Type of Therapy:  Group Therapy  Summary of Progress: Pt. Presented as talkative, engaged in the group process. Pt. Discussed her plans for the weekend. Pt. Participated in discussion about use of the grounding series for the treatment of depression. Pt. Participated in presentation by the [place] [place] about staying safe and avoiding financial scams.      Shaune PollackBrown, Cleone Hulick B, LPC

## 2018-04-23 ENCOUNTER — Other Ambulatory Visit (HOSPITAL_COMMUNITY): Payer: PRIVATE HEALTH INSURANCE | Admitting: Psychiatry

## 2018-04-23 ENCOUNTER — Other Ambulatory Visit (HOSPITAL_COMMUNITY): Payer: Self-pay

## 2018-04-23 ENCOUNTER — Ambulatory Visit (HOSPITAL_COMMUNITY): Payer: Self-pay

## 2018-04-23 DIAGNOSIS — F332 Major depressive disorder, recurrent severe without psychotic features: Secondary | ICD-10-CM | POA: Diagnosis not present

## 2018-04-23 NOTE — Progress Notes (Signed)
    Daily Group Progress Note  Program: IOP  Group Time: 9:00-12:00  Participation Level: Active  Behavioral Response: Appropriate  Type of Therapy:  Group Therapy  Summary of Progress: Pt. Presented with bright affect, talkative, engaged in group process. Pt. 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 Pt.  Participated in grief and loss session with the Chaplain.    Shaune PollackBrown, Aseel Uhde B, LPC

## 2018-04-24 ENCOUNTER — Ambulatory Visit (HOSPITAL_COMMUNITY): Payer: Self-pay

## 2018-04-24 ENCOUNTER — Other Ambulatory Visit (HOSPITAL_COMMUNITY): Payer: Self-pay

## 2018-04-25 ENCOUNTER — Other Ambulatory Visit (HOSPITAL_COMMUNITY): Payer: PRIVATE HEALTH INSURANCE | Admitting: Psychiatry

## 2018-04-25 ENCOUNTER — Ambulatory Visit (HOSPITAL_COMMUNITY): Payer: Self-pay

## 2018-04-25 ENCOUNTER — Other Ambulatory Visit (HOSPITAL_COMMUNITY): Payer: Self-pay

## 2018-04-25 DIAGNOSIS — F332 Major depressive disorder, recurrent severe without psychotic features: Secondary | ICD-10-CM

## 2018-04-25 NOTE — Progress Notes (Signed)
    Daily Group Progress Note  Program: IOP  Group Time: 9:00-12:00  Participation Level: Active  Behavioral Response: Appropriate  Type of Therapy:  Group Therapy  Summary of Progress: Pt. Presented as sad, appropriately tearful. Pt. Discussed recent issues in her relationship with her boyfriend. Pt. Participated in activity to identify potential barriers to meeting goals. Pt. Participated in discussion of Personal Bill of Rights.      Shaune PollackBrown, Jennifer B, LPC

## 2018-04-26 ENCOUNTER — Other Ambulatory Visit (HOSPITAL_COMMUNITY): Payer: PRIVATE HEALTH INSURANCE | Admitting: Psychiatry

## 2018-04-26 ENCOUNTER — Ambulatory Visit (HOSPITAL_COMMUNITY): Payer: Self-pay

## 2018-04-26 ENCOUNTER — Other Ambulatory Visit (HOSPITAL_COMMUNITY): Payer: Self-pay

## 2018-04-26 DIAGNOSIS — F332 Major depressive disorder, recurrent severe without psychotic features: Secondary | ICD-10-CM

## 2018-04-29 ENCOUNTER — Other Ambulatory Visit (HOSPITAL_COMMUNITY): Payer: PRIVATE HEALTH INSURANCE | Admitting: Psychiatry

## 2018-04-29 ENCOUNTER — Other Ambulatory Visit (HOSPITAL_COMMUNITY): Payer: Self-pay

## 2018-04-29 ENCOUNTER — Ambulatory Visit (HOSPITAL_COMMUNITY): Payer: Self-pay

## 2018-04-29 DIAGNOSIS — F332 Major depressive disorder, recurrent severe without psychotic features: Secondary | ICD-10-CM | POA: Diagnosis not present

## 2018-04-30 ENCOUNTER — Other Ambulatory Visit (HOSPITAL_COMMUNITY): Payer: Self-pay

## 2018-04-30 ENCOUNTER — Ambulatory Visit (HOSPITAL_COMMUNITY): Payer: Self-pay

## 2018-04-30 ENCOUNTER — Other Ambulatory Visit (HOSPITAL_COMMUNITY): Payer: PRIVATE HEALTH INSURANCE | Admitting: Psychiatry

## 2018-04-30 NOTE — Progress Notes (Signed)
    Daily Group Progress Note  Program: IOP  Time: 9:00-12:00  Summary of Progress: Type of Therapy:  Psychoeducational Skills   Participation Level:  Active   Participation Quality:  Appropriate   Affect:  Appropriate   Cognitive:  Appropriate   Insight:  Appropriate   Engagement in Group:  Engaged   Modes of Intervention:  Problem-solving  Maureen Waters is a 32 y.o. is a female that presented to Psych IOP. Counselor utilized motivation interviewing skills and group processing as a means for therapeutic intervention. Pt has an hx of depression, grief, and anxiety This first and second hour of today's session focused on "Mind Set Matters" with focus on changing a "Fixed Mindset" to a "Growth Mindset".  Counselor elicited examples/feedback from patient and other group members to share experiences that are associated with moving toward a growth. Counselor provided a handout to provided examples. Clients were then asked to practice Mindset Matters" with each other. Client shared an example, "Instead of saying I can't do it I will say I'm right on track" or "Instead of stating I'm not good enough I will say I'm just getting started".  Counselor and group praised [name] for her motivation towards self-improvement.  Pt is dressed in street clothes, alert, oriented x4 with normal speech and normal motor behavior. Eye contact is fair. Pt's mood is appropriate and affect is congruent with mood. Thought process is coherent and relevant. Pt's insight is fair and judgement is fair. There is no indication that patient  is currently responding to internal stimuli or experiencing delusional thought content. Pt was cooperative throughout assessment and relevant. Pt's insight is fair and judgement is fair. Pt was cooperative throughout the group session on this day.      Shaune PollackBrown, Vincentina Sollers B, LPC

## 2018-04-30 NOTE — Progress Notes (Signed)
    Daily Group Progress Note  Program: IOP  Group Time: 9:00-12:00  Participation Level: Active  Behavioral Response: Appropriate  Type of Therapy:  Group Therapy  Summary of Progress: Pt. Presented in bright affect, talkative, engaged in the group process. Pt. Discussed that challenge with her boyfriend was getting better and she was feeling better about the relationship. Pt. 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 Pt. Discussed her pattern of processing events internally before seeking the support of her friends and family and that this is a pattern that she wants to change. Pt. Participated in discussion about rediscovering laughter and self-care activities.       Shaune PollackBrown, Jennifer B, LPC

## 2018-05-01 ENCOUNTER — Other Ambulatory Visit (HOSPITAL_COMMUNITY): Payer: PRIVATE HEALTH INSURANCE | Admitting: Psychiatry

## 2018-05-01 ENCOUNTER — Other Ambulatory Visit (HOSPITAL_COMMUNITY): Payer: Self-pay

## 2018-05-01 DIAGNOSIS — F332 Major depressive disorder, recurrent severe without psychotic features: Secondary | ICD-10-CM

## 2018-05-01 NOTE — Progress Notes (Signed)
    Daily Group Progress Note  Program: IOP  Time: 9:00-12:00  Summary of Progress: Type of Therapy:  Psychoeducational Skills   Participation Level:  Active   Participation Quality:  Appropriate   Affect:  Appropriate   Cognitive:  Appropriate   Insight:  Appropriate   Engagement in Group:  Engaged   Modes of Intervention:  Problem-solving  Maureen Waters is a 32 y.o. is a female that presented to Psych IOP. Counselor utilized motivation interviewing skills and group processing as a means for therapeutic intervention. Pt has an hx of depression, grief, and anxiety This first and second hour of today's session focused on "Mind Set Matters" with focus on changing a "Fixed Mindset" to a "Growth Mindset".  Counselor elicited examples/feedback from patient and other group members to share experiences that are associated with moving toward a growth. Counselor provided a handout to provided examples. Clients were then asked to practice Mindset Matters" with each other. Client shared an example, "Instead of saying I can't do it I will say I'm right on track" or "Instead of stating I'm not good enough I will say I'm just getting started".  Counselor and group praised [name] for her motivation towards self-improvement.  Pt is dressed in street clothes, alert, oriented x4 with normal speech and normal motor behavior. Eye contact is fair. Pt's mood is appropriate and affect is congruent with mood. Thought process is coherent and relevant. Pt's insight is fair and judgement is fair. There is no indication that patient  is currently responding to internal stimuli or experiencing delusional thought content. Pt was cooperative throughout assessment and relevant. Pt's insight is fair and judgement is fair. Pt was cooperative throughout the group session on this day.      [facility]

## 2018-05-02 ENCOUNTER — Ambulatory Visit (HOSPITAL_COMMUNITY): Payer: Self-pay

## 2018-05-02 ENCOUNTER — Other Ambulatory Visit (HOSPITAL_COMMUNITY): Payer: Self-pay

## 2018-05-02 ENCOUNTER — Other Ambulatory Visit (HOSPITAL_COMMUNITY): Payer: PRIVATE HEALTH INSURANCE | Admitting: Psychiatry

## 2018-05-02 DIAGNOSIS — F332 Major depressive disorder, recurrent severe without psychotic features: Secondary | ICD-10-CM | POA: Diagnosis not present

## 2018-05-02 NOTE — Progress Notes (Signed)
    Daily Group Progress Note  Program: IOP  Group Time: 9:00-12:00  Participation Level: Active  Behavioral Response: Appropriate  Type of Therapy:  Group Therapy  Summary of Progress: Pt. Presented with brightened affect, engaged in the group process. Pt. Connected with other group member around her grief process related to loss of a grandparent. Pt. Discussed being intentional about allowing herself to grieve and that it has been a process of two years and that she continues to have periods of sadness, but is not able to remember happy moments with her grandmother. Pt. Participated in discussion about behaviors to engage in when you begin to feel hopeless about your depression and recognizing situations that you are avoiding that trigger anxiety. Pt. Participated in discussion facilitated by [employer] [facility] [place].    Shaune PollackBrown, Janet Humphreys B, LPC

## 2018-05-03 ENCOUNTER — Other Ambulatory Visit (HOSPITAL_COMMUNITY): Payer: PRIVATE HEALTH INSURANCE | Admitting: Psychiatry

## 2018-05-03 ENCOUNTER — Ambulatory Visit (HOSPITAL_COMMUNITY): Payer: Self-pay

## 2018-05-03 ENCOUNTER — Other Ambulatory Visit (HOSPITAL_COMMUNITY): Payer: Self-pay

## 2018-05-03 DIAGNOSIS — F332 Major depressive disorder, recurrent severe without psychotic features: Secondary | ICD-10-CM

## 2018-05-06 ENCOUNTER — Other Ambulatory Visit (HOSPITAL_COMMUNITY): Payer: PRIVATE HEALTH INSURANCE | Attending: Psychiatry

## 2018-05-06 DIAGNOSIS — F419 Anxiety disorder, unspecified: Secondary | ICD-10-CM | POA: Insufficient documentation

## 2018-05-06 DIAGNOSIS — R45851 Suicidal ideations: Secondary | ICD-10-CM | POA: Insufficient documentation

## 2018-05-06 DIAGNOSIS — F332 Major depressive disorder, recurrent severe without psychotic features: Secondary | ICD-10-CM | POA: Insufficient documentation

## 2018-05-07 ENCOUNTER — Other Ambulatory Visit (HOSPITAL_COMMUNITY): Payer: PRIVATE HEALTH INSURANCE | Admitting: Psychiatry

## 2018-05-07 DIAGNOSIS — R45851 Suicidal ideations: Secondary | ICD-10-CM | POA: Diagnosis not present

## 2018-05-07 DIAGNOSIS — F332 Major depressive disorder, recurrent severe without psychotic features: Secondary | ICD-10-CM

## 2018-05-07 DIAGNOSIS — F419 Anxiety disorder, unspecified: Secondary | ICD-10-CM | POA: Diagnosis present

## 2018-05-07 NOTE — Progress Notes (Signed)
    Daily Group Progress Note  Program: IOP  Group Time: 9:00-12:00  Participation Level: Active  Behavioral Response: Appropriate  Type of Therapy:  Group Therapy  Summary of Progress: Pt. Presented with bright affect, talkative, engaged in the group process. Pt. Received and shared feedback with the group about learning to communicate her needs to her partner and her close friends and family. Pt. Participated in guided meditation exercise and review of the grounding series with focus on 4-3-8 breathing to manage stress and anxiety.     Shaune PollackBrown, Maureen Waters, LPC

## 2018-05-08 ENCOUNTER — Other Ambulatory Visit (HOSPITAL_COMMUNITY): Payer: PRIVATE HEALTH INSURANCE | Admitting: Psychiatry

## 2018-05-08 ENCOUNTER — Ambulatory Visit (HOSPITAL_COMMUNITY): Payer: Self-pay | Admitting: Psychiatry

## 2018-05-08 DIAGNOSIS — F332 Major depressive disorder, recurrent severe without psychotic features: Secondary | ICD-10-CM

## 2018-05-08 NOTE — Progress Notes (Signed)
    Daily Group Progress Note  Program: IOP  Group Time: 9:00-12:00  Participation Level: Active  Behavioral Response: Appropriate  Type of Therapy:  Group Therapy  Summary of Progress: Pt. Presents as alert, talkative, engaged in the group process. Pt. Participated in discussion about managing work related stress and healthy work relationships. Pt. Continues to discuss the loss of her grandmother and seeking the emotional support from friends and family as needed.     Shaune PollackBrown, Diandre Merica B, LPC

## 2018-05-10 ENCOUNTER — Other Ambulatory Visit (HOSPITAL_COMMUNITY): Payer: PRIVATE HEALTH INSURANCE | Admitting: Psychiatry

## 2018-05-10 DIAGNOSIS — F332 Major depressive disorder, recurrent severe without psychotic features: Secondary | ICD-10-CM | POA: Diagnosis not present

## 2018-05-12 ENCOUNTER — Encounter (HOSPITAL_COMMUNITY): Payer: Self-pay | Admitting: Family
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
## 2018-05-13 ENCOUNTER — Other Ambulatory Visit (HOSPITAL_COMMUNITY): Payer: PRIVATE HEALTH INSURANCE | Admitting: Psychiatry

## 2018-05-13 DIAGNOSIS — F332 Major depressive disorder, recurrent severe without psychotic features: Secondary | ICD-10-CM | POA: Diagnosis not present

## 2018-05-13 NOTE — Progress Notes (Signed)
    Daily Group Progress Note  Program: IOP  Group Time: 9:00-12:00  Participation Level: Active  Behavioral Response: Appropriate  Type of Therapy:  Group Therapy  Summary of Progress: Pt. Presents as talkative, alert, engaged in the group process. Pt. Participated in reflective reading about stating needs in relationships in order for the relationship to grow. Pt. Discussed having fun with her family over the 4th of July holiday and plans to move from her current apartment. Pt. Expressed excitement about moving forward with her move and some apprehension about her return to work.     Shaune PollackBrown, Chana Lindstrom B, LPC

## 2018-05-13 NOTE — Patient Instructions (Signed)
D:  Patient successfully completed MH-IOP today.  A:  Discharge today.  Follow up with Dr. Lajoyce CornersNanette Funderburk for counseling.  Dr. Lolly MustacheArfeen on 07-13-18 @ 2 pm.  Encouraged support groups.  Recommended [facility] on Tuesday evenings 5:30 - 6:30 pm with Idalia NeedleBeth MacKenzie, LCAS [phone].  Call her first before starting.  R:  Pt receptive.

## 2018-05-13 NOTE — Progress Notes (Signed)
Maureen Waters is a 32 y.o. , single, employed, [place]; [facility] Pt reported suffering from depression and anxiety for years but has not received treatment. Pt reported grandmother passed away 2 years ago and she still has not handled the grief. Pt reported symptoms have become much worse lately and are interfering with her ability to function. Pt reported struggling with work and completing tasks that "used to be easy." Pt shared she found it hard to concentrate, lacks motivation, feels hopeless, worthless, and finds herself isolating "and I used to be called the 'social butterfly' of the family." Pt reported she was having passive SI; but currently denies any SI.  Pt states she has a lot of regrets about not completing college and tends to ruminate. Pt reports she is struggling with finances and "getting my life on track." Pt continues to deny HI/AVH.  Reports boyfriend of three years is very supportive; but they are interested in couples counseling. Pt completed MH-IOP today.  Overall mood is improving; but continues to struggle with anxiety.  Pt is currently moving in with a friend today.  Anxious about returning to work. Pt completed all forms and scored 16 on the exist burns depression checklist.  A:  D/C today.  F/U with Dr. Lajoyce CornersNanette Funderburk.  Pt states she hadn't called because she is awaiting boyfriend's schedule; encouraged her to go ahead and get on Dr. Lynwood DawleyFunderburk's schedule for herself and eventually boyfriend could be invited in for a session possibly.  Encouraged support groups.  Recommended Beth MacKenzie's aftercare group on Tuesday evenings.  Provided pt with phone number.  Also, f/u with Dr. Lolly MustacheArfeen on 07-13-18 @ 2pm.  Will recommend that pt not return to work until 05-20-18 (without any restrictions).  This will give patient hopefully time to attend an aftercare group, support groups and obtain appointment with Dr. Lennette BihariFunderburk or someone in her office if not a  soon appointment.  R:  Pt receptive.          Chestine SporeLARK, RITA

## 2018-05-14 ENCOUNTER — Other Ambulatory Visit (HOSPITAL_COMMUNITY): Payer: PRIVATE HEALTH INSURANCE

## 2018-05-14 NOTE — Progress Notes (Signed)
    Daily Group Progress Note  Program: IOP  Group Time: 9:00-12:00  Participation Level: Active  Behavioral Response: Appropriate  Type of Therapy:  Group Therapy  Summary of Progress: Pt. Presents with bright affect, talkative, engaged in group process. Pt. Discussed her plans for 4th of July holiday and excited about spending time with her boyfriend and family. Pt. Discussed her plans to move from her current apartment and job transition.     Shaune PollackBrown, Jennifer B, LPC

## 2018-05-15 ENCOUNTER — Other Ambulatory Visit (HOSPITAL_COMMUNITY): Payer: PRIVATE HEALTH INSURANCE

## 2018-05-15 NOTE — Progress Notes (Signed)
Daily Group Progress Note  Program: IOP  Group Time: 9:00-12:00  Participation Level: Active  Behavioral Response: Appropriate  Type of Therapy:  Group Therapy  Summary of Progress: Pt. Presented with brightened affect, talkative, engaged in the group process. Pt. Met with the case manager completed discharge planning. Pt. Participated in discussion about medication management, sleep, ongoing self-care and nutrition.     Brown, Jennifer B, LPC 

## 2018-05-16 ENCOUNTER — Other Ambulatory Visit (HOSPITAL_COMMUNITY): Payer: PRIVATE HEALTH INSURANCE

## 2018-05-17 ENCOUNTER — Other Ambulatory Visit (HOSPITAL_COMMUNITY): Payer: PRIVATE HEALTH INSURANCE

## 2018-05-20 ENCOUNTER — Other Ambulatory Visit (HOSPITAL_COMMUNITY): Payer: PRIVATE HEALTH INSURANCE

## 2018-05-21 ENCOUNTER — Other Ambulatory Visit (HOSPITAL_COMMUNITY): Payer: PRIVATE HEALTH INSURANCE

## 2018-05-22 ENCOUNTER — Other Ambulatory Visit (HOSPITAL_COMMUNITY): Payer: PRIVATE HEALTH INSURANCE

## 2018-05-23 ENCOUNTER — Other Ambulatory Visit (HOSPITAL_COMMUNITY): Payer: PRIVATE HEALTH INSURANCE

## 2018-05-24 ENCOUNTER — Other Ambulatory Visit (HOSPITAL_COMMUNITY): Payer: PRIVATE HEALTH INSURANCE

## 2018-07-13 ENCOUNTER — Ambulatory Visit (HOSPITAL_COMMUNITY): Payer: Self-pay | Admitting: Psychiatry

## 2018-09-05 NOTE — Progress Notes (Deleted)
Psychiatric Initial Adult Assessment   Patient Identification: Maureen Waters MRN:  [id number] Date of Evaluation:  09/05/2018 Referral Source: *** Chief Complaint:   Visit Diagnosis: No diagnosis found.  History of Present Illness:   Maureen Waters is a 31 y.o. year old female with a history of depression, anxiety, who is referred for after care. She enrolled in PHP and IOP for depression.      Associated Signs/Symptoms: Depression Symptoms:  {DEPRESSION SYMPTOMS:20000} (Hypo) Manic Symptoms:  {BHH MANIC SYMPTOMS:22872} Anxiety Symptoms:  {BHH ANXIETY SYMPTOMS:22873} Psychotic Symptoms:  {BHH PSYCHOTIC SYMPTOMS:22874} PTSD Symptoms: {BHH PTSD SYMPTOMS:22875}  Past Psychiatric History:  Outpatient:  Psychiatry admission:  Previous suicide attempt:  Past trials of medication:  History of violence:   Previous Psychotropic Medications: {YES/NO:21197}  Substance Abuse History in the last 12 months:  {yes no:314532}  Consequences of Substance Abuse: {BHH CONSEQUENCES OF SUBSTANCE ABUSE:22880}  Past Medical History:  Past Medical History:  Diagnosis Date  . Allergy    HX OF SEASONAL ALLERGIES  . Anxiety   . Chlamydia    06/2010  . Depression   . LGSIL (low grade squamous intraepithelial lesion) on Pap smear 06/17/2010   with high risk HPV.  01/2011 pap was normal   No past surgical history on file.  Family Psychiatric History: ***  Family History:  Family History  Problem Relation Age of Onset  . Arthritis Mother   . Hypertension Mother   . Depression Mother   . Alcohol abuse Father   . Asthma Brother        and allergies  . Arthritis Maternal Grandmother   . Cancer Maternal Grandmother   . Diabetes Maternal Grandfather   . Hypertension Maternal Grandfather   . Alcohol abuse Paternal Grandfather     Social History:   Social History   Socioeconomic History  . Marital status: Single    Spouse name: Not on file  . Number of children: 0  . Years of  education: Not on file  . Highest education level: Not on file  Occupational History  . Occupation: Production designer, theatre/television/film at [employer]  . Financial resource strain: Somewhat hard  . Food insecurity:    Worry: Never true    Inability: Never true  . Transportation needs:    Medical: No    Non-medical: No  Tobacco Use  . Smoking status: Never Smoker  . Smokeless tobacco: Never Used  Substance and Sexual Activity  . Alcohol use: Yes    Alcohol/week: 1.0 standard drinks    Types: 1 Glasses of wine per week    Comment: glass of wine every couple of weeks   . Drug use: Yes    Types: Marijuana    Comment: twice a month  . Sexual activity: Yes    Partners: Male    Birth control/protection: Implant  Lifestyle  . Physical activity:    Days per week: 3 days    Minutes per session: 120 min  . Stress: Only a little  Relationships  . Social connections:    Talks on phone: More than three times a week    Gets together: More than three times a week    Attends religious service: More than 4 times per year    Active member of club or organization: Yes    Attends meetings of clubs or organizations: More than 4 times per year    Relationship status: Never married  Other Topics Concern  . Not on file  Social  History Narrative   Lives alone, no pets.  Family lives in [place]    Additional Social History: ***  Allergies:   Allergies  Allergen Reactions  . Peanut-Containing Drug Products     Shortness of breath     Metabolic Disorder Labs: No results found for: HGBA1C, MPG No results found for: PROLACTIN Lab Results  Component Value Date   CHOL 119 12/29/2011   TRIG 29 12/29/2011   HDL 61 12/29/2011   CHOLHDL 2.0 12/29/2011   VLDL 6 12/29/2011   LDLCALC 52 12/29/2011     Current Medications: Current Outpatient Medications  Medication Sig Dispense Refill  . etonogestrel (NEXPLANON) 68 MG IMPL implant 1 each by Subdermal route once.    . Multiple Vitamin  (MULTIVITAMIN) capsule Take 1 capsule by mouth daily.    . naproxen sodium (ALEVE) 220 MG tablet Take 220 mg by mouth daily as needed.    . traZODone (DESYREL) 50 MG tablet Take 1 tablet (50 mg total) by mouth at bedtime. 30 tablet 0   No current facility-administered medications for this visit.     Neurologic: Headache: No Seizure: No Paresthesias:No  Musculoskeletal: Strength & Muscle Tone: within normal limits Gait & Station: normal Patient leans: N/A  Psychiatric Specialty Exam: ROS  There were no vitals taken for this visit.There is no height or weight on file to calculate BMI.  General Appearance: Fairly Groomed  Eye Contact:  Good  Speech:  Clear and Coherent  Volume:  Normal  Mood:  {BHH MOOD:22306}  Affect:  {Affect (PAA):22687}  Thought Process:  Coherent  Orientation:  Full (Time, Place, and Person)  Thought Content:  Logical  Suicidal Thoughts:  {ST/HT (PAA):22692}  Homicidal Thoughts:  {ST/HT (PAA):22692}  Memory:  Immediate;   Good  Judgement:  {Judgement (PAA):22694}  Insight:  {Insight (PAA):22695}  Psychomotor Activity:  Normal  Concentration:  Concentration: Good and Attention Span: Good  Recall:  Good  Fund of Knowledge:Good  Language: Good  Akathisia:  No  Handed:  Right  AIMS (if indicated):  N/A  Assets:  Communication Skills Desire for Improvement  ADL's:  Intact  Cognition: WNL  Sleep:  ***   Assessment  Plan  The patient demonstrates the following risk factors for suicide: Chronic risk factors for suicide include: {Chronic Risk Factors for ZOXWRUE:45409811}. Acute risk factors for suicide include: {Acute Risk Factors for BJYNWGN:56213086}. Protective factors for this patient include: {Protective Factors for Suicide VHQI:69629528}. Considering these factors, the overall suicide risk at this point appears to be {Desc; low/moderate/high:110033}. Patient {ACTION; IS/IS UXL:24401027} appropriate for outpatient follow up.   Treatment Plan  Summary: Plan as above   Neysa Hotter, MD 10/31/20191:08 PM

## 2018-09-07 ENCOUNTER — Ambulatory Visit (HOSPITAL_COMMUNITY): Payer: PRIVATE HEALTH INSURANCE | Admitting: Psychiatry

## 2021-01-13 ENCOUNTER — Encounter (HOSPITAL_COMMUNITY): Payer: Self-pay | Admitting: Pharmacy Technician

## 2021-01-13 ENCOUNTER — Other Ambulatory Visit: Payer: Self-pay

## 2021-01-13 ENCOUNTER — Emergency Department (HOSPITAL_COMMUNITY)
Admission: EM | Admit: 2021-01-13 | Discharge: 2021-01-13 | Disposition: A | Discharge status: Home / Self Care | Payer: 59 | Attending: Emergency Medicine | Admitting: Emergency Medicine

## 2021-01-13 ENCOUNTER — Emergency Department (HOSPITAL_COMMUNITY): Payer: 59

## 2021-01-13 DIAGNOSIS — K529 Noninfective gastroenteritis and colitis, unspecified: Secondary | ICD-10-CM | POA: Diagnosis not present

## 2021-01-13 DIAGNOSIS — Z91010 Allergy to peanuts: Secondary | ICD-10-CM | POA: Insufficient documentation

## 2021-01-13 DIAGNOSIS — R109 Unspecified abdominal pain: Secondary | ICD-10-CM | POA: Diagnosis present

## 2021-01-13 LAB — COMPREHENSIVE METABOLIC PANEL
ALT: 10 U/L (ref 0–44)
AST: 16 U/L (ref 15–41)
Albumin: 4.2 g/dL (ref 3.5–5.0)
Alkaline Phosphatase: 54 U/L (ref 38–126)
Anion gap: 8 (ref 5–15)
BUN: 9 mg/dL (ref 6–20)
CO2: 25 mmol/L (ref 22–32)
Calcium: 9.1 mg/dL (ref 8.9–10.3)
Chloride: 105 mmol/L (ref 98–111)
Creatinine, Ser: 0.72 mg/dL (ref 0.44–1.00)
GFR, Estimated: >60 mL/min (ref >60)
Glucose, Bld: 120 mg/dL — ABNORMAL HIGH (ref 70–99)
Potassium: 4 mmol/L (ref 3.5–5.1)
Sodium: 138 mmol/L (ref 135–145)
Total Bilirubin: 1.2 mg/dL (ref 0.3–1.2)
Total Protein: 7.5 g/dL (ref 6.5–8.1)

## 2021-01-13 LAB — URINALYSIS, ROUTINE W REFLEX MICROSCOPIC
Bilirubin Urine: NEGATIVE
Glucose, UA: NEGATIVE mg/dL
Hgb urine dipstick: NEGATIVE
Ketones, ur: 80 mg/dL — AB
Leukocytes,Ua: NEGATIVE
Nitrite: NEGATIVE
Protein, ur: NEGATIVE mg/dL
Specific Gravity, Urine: 1.016 (ref 1.005–1.030)
pH: 6 (ref 5.0–8.0)

## 2021-01-13 LAB — LIPASE, BLOOD: Lipase: 30 U/L (ref 11–51)

## 2021-01-13 LAB — CBC
HCT: 43.4 % (ref 36.0–46.0)
Hemoglobin: 13.8 g/dL (ref 12.0–15.0)
MCH: 27.1 pg (ref 26.0–34.0)
MCHC: 31.8 g/dL (ref 30.0–36.0)
MCV: 85.3 fL (ref 80.0–100.0)
Platelets: 237 10*3/uL (ref 150–400)
RBC: 5.09 MIL/uL (ref 3.87–5.11)
RDW: 12.6 % (ref 11.5–15.5)
WBC: 8.9 10*3/uL (ref 4.0–10.5)
nRBC: 0 % (ref 0.0–0.2)

## 2021-01-13 LAB — I-STAT BETA HCG BLOOD, ED (MC, WL, AP ONLY): I-stat hCG, quantitative: <5 m[IU]/mL (ref <5)

## 2021-01-13 MED ORDER — SODIUM CHLORIDE 0.9 % IV BOLUS
1000.0000 mL | Freq: Once | INTRAVENOUS | Status: AC
  Administered 2021-01-13: 1000 mL via INTRAVENOUS

## 2021-01-13 MED ORDER — ONDANSETRON 4 MG PO TBDP: 4.0000 mg | ORAL_TABLET | Freq: Three times a day (TID) | ORAL | 0 refills | Status: DC | PRN

## 2021-01-13 MED ORDER — ONDANSETRON HCL 4 MG/2ML IJ SOLN
4.0000 mg | Freq: Once | INTRAMUSCULAR | Status: AC
  Administered 2021-01-13: 4 mg via INTRAVENOUS
  Filled 2021-01-13: qty 2

## 2021-01-13 MED ORDER — IOHEXOL 300 MG/ML  SOLN
100.0000 mL | Freq: Once | INTRAMUSCULAR | Status: AC | PRN
  Administered 2021-01-13: 100 mL via INTRAVENOUS

## 2021-01-13 MED ORDER — DICYCLOMINE HCL 10 MG PO CAPS
10.0000 mg | ORAL_CAPSULE | Freq: Once | ORAL | Status: AC
  Administered 2021-01-13: 10 mg via ORAL
  Filled 2021-01-13: qty 1

## 2021-01-13 MED ORDER — MORPHINE SULFATE (PF) 2 MG/ML IV SOLN
2.0000 mg | Freq: Once | INTRAVENOUS | Status: AC
  Administered 2021-01-13: 2 mg via INTRAVENOUS
  Filled 2021-01-13: qty 1

## 2021-01-13 MED ORDER — DICYCLOMINE HCL 20 MG PO TABS: 20.0000 mg | ORAL_TABLET | Freq: Three times a day (TID) | ORAL | 0 refills | Status: DC | PRN

## 2021-01-13 MED ORDER — ACETAMINOPHEN 500 MG PO TABS
1000.0000 mg | ORAL_TABLET | Freq: Once | ORAL | Status: AC
  Administered 2021-01-13: 1000 mg via ORAL
  Filled 2021-01-13: qty 2

## 2021-01-13 NOTE — ED Notes (Signed)
Pt d/c by MD and is provided w/ d/c instructions and follow up care, pt out of ED ambulatory with family  

## 2021-01-13 NOTE — ED Notes (Signed)
Patient transported to CT 

## 2021-01-13 NOTE — ED Triage Notes (Signed)
Pt here with reports of sharp abdominal pain generalized in nature onset today.

## 2021-01-13 NOTE — ED Provider Notes (Signed)
[facility] EMERGENCY DEPARTMENT Provider Note   CSN: [id number] Arrival date & time: 01/13/21  1208     History Chief Complaint  Patient presents with  . Abdominal Pain    Maureen Waters is a 35 y.o. female had significant past medical history, presenting to the emergency department with complaint of abdominal pain with nausea, vomiting, diarrhea that began early this morning.  She states she was woken around 6 AM with sharp mid/epigastric abdominal pains.  Shortly after she began vomiting and also had diarrhea.  She had vomiting and diarrhea throughout the morning.  She is waves of sharp abdominal pain that are coming and going.  She has felt sweating though no documented fever.  Last night she had [place] food with shrimp.  No sick contacts.  No history of abdominal surgeries.  No urinary symptoms  The history is provided by the patient.       Past Medical History:  Diagnosis Date  . Allergy    HX OF SEASONAL ALLERGIES  . Anxiety   . Chlamydia    06/2010  . Depression   . LGSIL (low grade squamous intraepithelial lesion) on Pap smear 06/17/2010   with high risk HPV.  01/2011 pap was normal    Patient Active Problem List   Diagnosis Date Noted  . Visual impairment 08/22/2016  . Postcoital bleeding 12/29/2011    History reviewed. No pertinent surgical history.   OB History    Gravida  0   Para  0   Term  0   Preterm  0   AB  0   Living  0     SAB  0   IAB  0   Ectopic  0   Multiple  0   Live Births              Family History  Problem Relation Age of Onset  . Arthritis Mother   . Hypertension Mother   . Depression Mother   . Alcohol abuse Father   . Asthma Brother        and allergies  . Arthritis Maternal Grandmother   . Cancer Maternal Grandmother   . Diabetes Maternal Grandfather   . Hypertension Maternal Grandfather   . Alcohol abuse Paternal Grandfather     Social History   Tobacco Use  . Smoking status:  Never Smoker  . Smokeless tobacco: Never Used  Vaping Use  . Vaping Use: Never used  Substance Use Topics  . Alcohol use: Yes    Alcohol/week: 1.0 standard drink    Types: 1 Glasses of wine per week    Comment: glass of wine every couple of weeks   . Drug use: Yes    Types: Marijuana    Comment: twice a month    Home Medications Prior to Admission medications   Medication Sig Start Date End Date Taking? Authorizing Provider  dicyclomine (BENTYL) 20 MG tablet Take 1 tablet (20 mg total) by mouth 3 (three) times daily as needed for spasms. 01/13/21  Yes Robinson, [place] N, PA-C  etonogestrel (NEXPLANON) 68 MG IMPL implant 68 mg by Subdermal route once.   Yes [provider]  naproxen sodium (ALEVE) 220 MG tablet Take 220 mg by mouth daily as needed (pain).   Yes [provider]  ondansetron (ZOFRAN ODT) 4 MG disintegrating tablet Take 1 tablet (4 mg total) by mouth every 8 (eight) hours as needed for nausea or vomiting. 01/13/21  Yes Robinson, [place] N,  PA-C  traZODone (DESYREL) 50 MG tablet Take 1 tablet (50 mg total) by mouth at bedtime. Patient not taking: No sig reported 04/19/18   Oneta Rack, NP    Allergies    Peanut-containing drug products  Review of Systems   Review of Systems  Gastrointestinal: Positive for abdominal pain, diarrhea, nausea and vomiting.  All other systems reviewed and are negative.   Physical Exam Updated Vital Signs BP 128/67   Pulse (!) 101   Temp 98.2 F (36.8 C) (Oral)   Resp 14   SpO2 93%   Physical Exam Vitals and nursing note reviewed.  Constitutional:      General: She is not in acute distress.    Appearance: She is well-developed.  HENT:     Head: Normocephalic and atraumatic.  Eyes:     Conjunctiva/sclera: Conjunctivae normal.  Cardiovascular:     Rate and Rhythm: Normal rate and regular rhythm.  Pulmonary:     Effort: Pulmonary effort is normal. No respiratory distress.     Breath sounds: Normal breath  sounds.  Abdominal:     General: Bowel sounds are normal.     Palpations: Abdomen is soft.     Tenderness: There is abdominal tenderness in the epigastric area, periumbilical area and suprapubic area. There is no guarding or rebound.  Skin:    General: Skin is warm.  Neurological:     Mental Status: She is alert.  Psychiatric:        Behavior: Behavior normal.     ED Results / Procedures / Treatments   Labs (all labs ordered are listed, but only abnormal results are displayed) Labs Reviewed  COMPREHENSIVE METABOLIC PANEL - Abnormal; Notable for the following components:      Result Value   Glucose, Bld 120 (*)    All other components within normal limits  URINALYSIS, ROUTINE W REFLEX MICROSCOPIC - Abnormal; Notable for the following components:   APPearance HAZY (*)    Ketones, ur 80 (*)    All other components within normal limits  LIPASE, BLOOD  CBC  I-STAT BETA HCG BLOOD, ED ([facility], [facility], [facility] ONLY)    EKG None  Radiology CT Abdomen Pelvis W Contrast  Result Date: 01/13/2021 CLINICAL DATA:  Acute abdominal pain. EXAM: CT ABDOMEN AND PELVIS WITH CONTRAST TECHNIQUE: Multidetector CT imaging of the abdomen and pelvis was performed using the standard protocol following bolus administration of intravenous contrast. CONTRAST:  OMNIPAQUE IOHEXOL 300 MG/ML  SOLN COMPARISON:  None. FINDINGS: Lower chest: The lung bases are clear. Hepatobiliary: No focal liver abnormality is seen. No gallstones, gallbladder wall thickening, or biliary dilatation. There is a fold in the gallbladder fundus. Pancreas: Unremarkable. No pancreatic ductal dilatation or surrounding inflammatory changes. Spleen: Normal in size without focal abnormality. Adrenals/Urinary Tract: Normal adrenal glands. No hydronephrosis or perinephric edema. Homogeneous renal enhancement. Urinary bladder is physiologically distended without wall thickening. Stomach/Bowel: Bowel evaluation is limited in the absence of enteric  contrast. Moderate length segment of edematous distal small bowel in the lower abdomen and pelvis with wall thickening and mesenteric edema. No abnormal distension. No bowel pneumatosis or obstruction. Normal appendix. Small volume of colonic stool. No obvious colonic wall thickening or inflammation. Vascular/Lymphatic: Normal caliber abdominal aorta. Duplicated IVC. Patent portal vein. No portal venous or mesenteric gas. No enlarged lymph nodes in the abdomen or pelvis. Reproductive: Uterus and bilateral adnexa are unremarkable. Small volume simple free fluid in the pelvis may be physiologic or reactive. Other: Small volume  simple free fluid in the pelvis may be physiologic or reactive. No upper abdominal ascites. No free air or focal fluid collection. Musculoskeletal: There are no acute or suspicious osseous abnormalities. IMPRESSION: 1. Moderate length segment of edematous distal small bowel in the lower abdomen and pelvis with wall thickening and mesenteric edema, consistent with enteritis, likely infectious or inflammatory. 2. Small volume simple free fluid in the pelvis may be physiologic or reactive. 3. Duplicated IVC. Electronically Signed   By: Narda Rutherford M.D.   On: 01/13/2021 19:19    Procedures Procedures   Medications Ordered in ED Medications  ondansetron (ZOFRAN) injection 4 mg (4 mg Intravenous Given 01/13/21 1740)  sodium chloride 0.9 % bolus 1,000 mL (0 mLs Intravenous Stopped 01/13/21 2054)  morphine 2 MG/ML injection 2 mg (2 mg Intravenous Given 01/13/21 1739)  iohexol (OMNIPAQUE) 300 MG/ML solution 100 mL (100 mLs Intravenous Contrast Given 01/13/21 1832)  ondansetron (ZOFRAN) injection 4 mg (4 mg Intravenous Given 01/13/21 1949)  dicyclomine (BENTYL) capsule 10 mg (10 mg Oral Given 01/13/21 2053)  acetaminophen (TYLENOL) tablet 1,000 mg (1,000 mg Oral Given 01/13/21 2053)    ED Course  I have reviewed the triage vital signs and the nursing notes.  Pertinent labs & imaging  results that were available during my care of the patient were reviewed by me and considered in my medical decision making (see chart for details).  Clinical Course as of 01/13/21 2107  Thu Jan 13, 2021  2000 Re-evaluated with overall improvement in symptoms.  Is still having some intermittent waves of pain and nausea though not as severe.  Tolerating p.o.  Will redose Zofran, give Tylenol and Bentyl and discharge home. [JR]    Clinical Course User Index [JR] Robinson, [place] N, PA-C   MDM Rules/Calculators/A&P                          Patient with symptoms and CT imaging consistent with gastroenteritis, likely viral vs food-borne illness.  Vitals are stable, no fever.  No signs of dehydration, tolerating PO fluids > 6 oz.  Lungs are clear. Labs are reassuring. Patient treated in the ED with pain medication, IVF, antiemetics with improvement in symptoms. Supportive therapy indicated with return if symptoms worsen.  Patient is in no distress at discharge. Rx for zofran and bentyl.    Discussed results, findings, treatment and follow up. Patient advised of return precautions. Patient verbalized understanding and agreed with plan.  Final Clinical Impression(s) / ED Diagnoses Final diagnoses:  Gastroenteritis    Rx / DC Orders ED Discharge Orders         Ordered    ondansetron (ZOFRAN ODT) 4 MG disintegrating tablet  Every 8 hours PRN        01/13/21 2046    dicyclomine (BENTYL) 20 MG tablet  3 times daily PRN        01/13/21 2046          Robinson, [place] N, PA-C 01/13/21 2109  Lorre Nick, MD 01/17/21 1406

## 2021-01-13 NOTE — Discharge Instructions (Addendum)
Please read instructions below. You can take tylenol every 6 hours as needed for pain. Drink clear liquids until your stomach feels better. Then, slowly introduce bland foods into your diet as tolerated, such as bread, rice, apples, bananas. You can take zofran every 8 hours as needed for nausea. Follow up with your primary care if symptoms persist. Return to the ER for severely worsening abdominal pain, fever, uncontrollable vomiting, or new or concerning symptoms.

## 2021-12-17 IMAGING — CT CT ABD-PELV W/ CM
2 of 4 series · 16 of 46 positions shown, 18 images · IV contrast (APPLIED)
Comparison: None.

CLINICAL DATA: Acute abdominal pain.

EXAM:
CT ABDOMEN AND PELVIS WITH CONTRAST
TECHNIQUE: Multidetector CT imaging of the abdomen and pelvis was performed
using the standard protocol following bolus administration of
intravenous contrast.
CONTRAST:  100mL OMNIPAQUE IOHEXOL 300 MG/ML  SOLN

[Series 3: abd/ pelvis 5.0 i30f 2 · axial · 0.87mm/px · z∈[+739,+1159]mm · 13 of 92 slices shown, 15 images]
[im 4/92  soft-tissue]
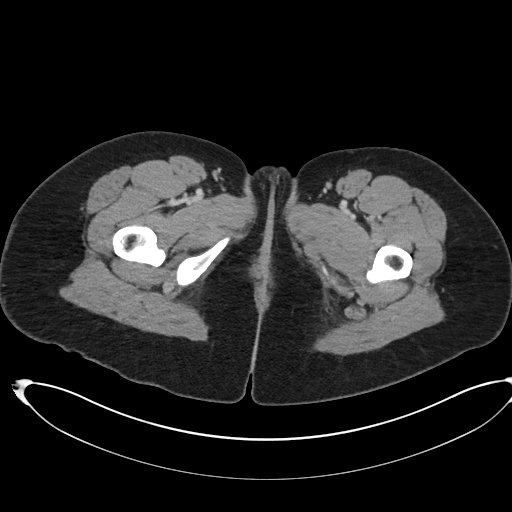
[im 4/92  bone]
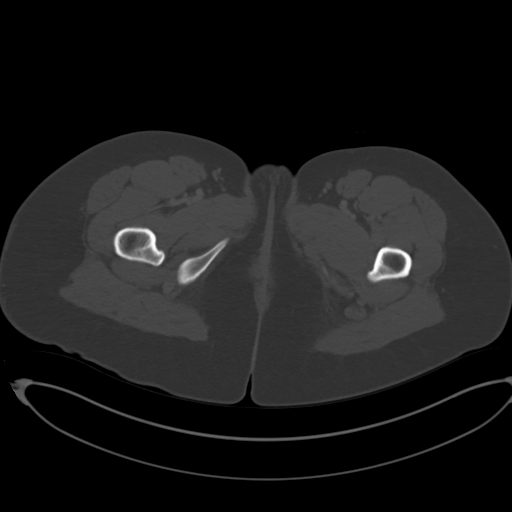
[im 11/92  soft-tissue]
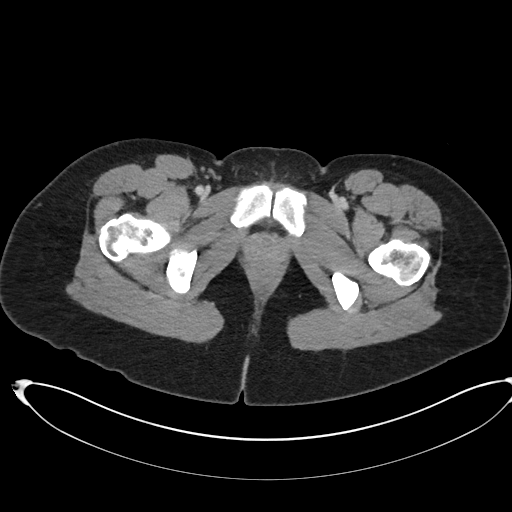
[im 18/92  soft-tissue]
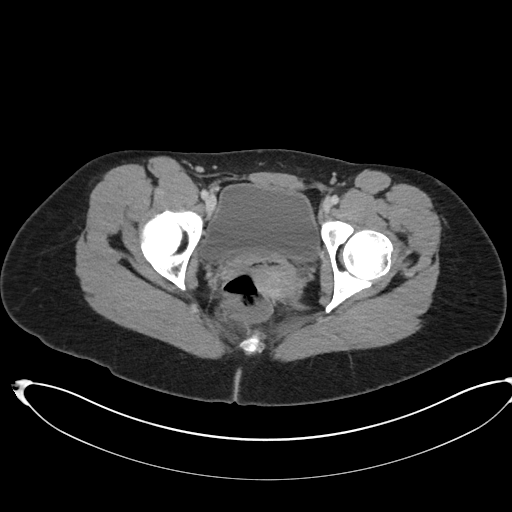
[im 25/92  soft-tissue]
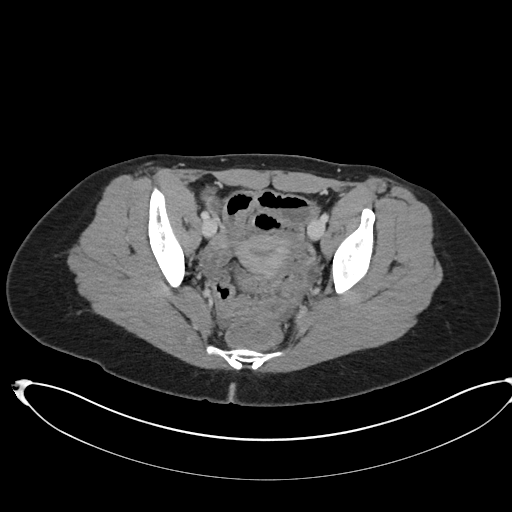
[im 32/92  soft-tissue]
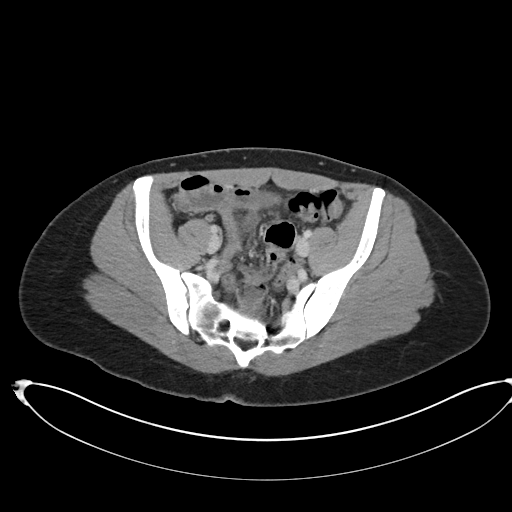
[im 39/92  soft-tissue]
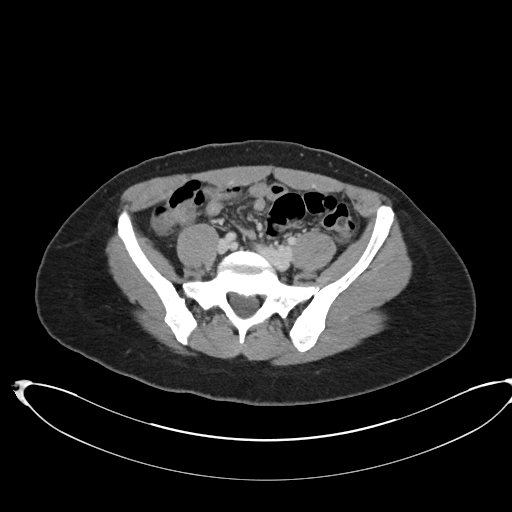
[im 46/92  soft-tissue]
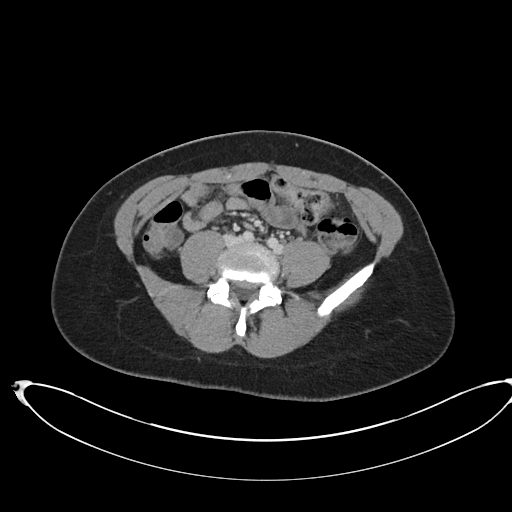
[im 53/92  soft-tissue]
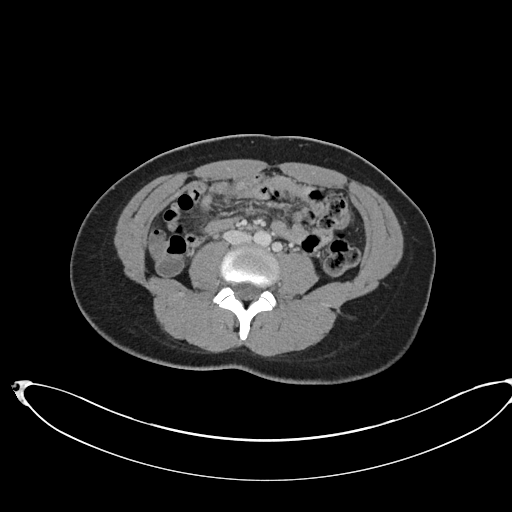
[im 60/92  soft-tissue]
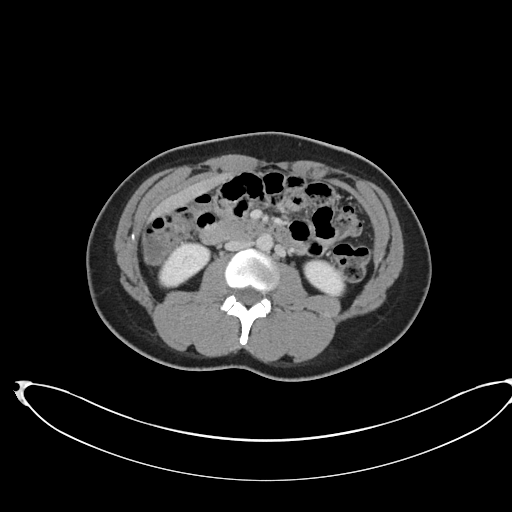
[im 60/92  bone]
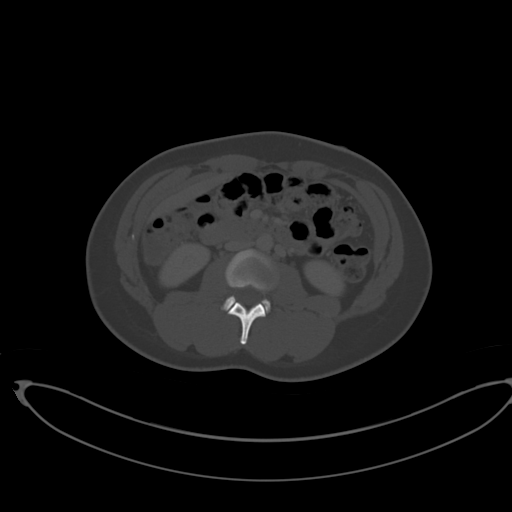
[im 67/92  soft-tissue]
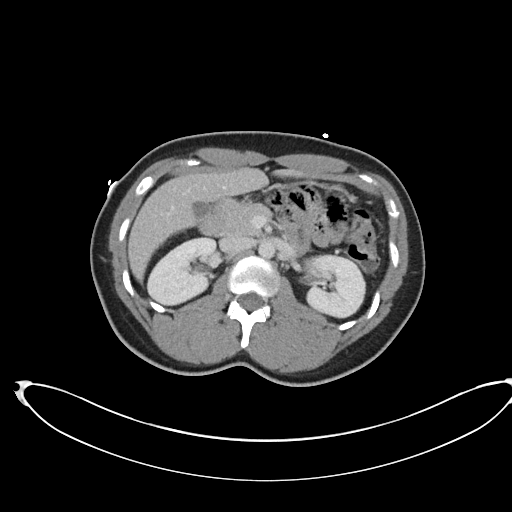
[im 74/92  soft-tissue]
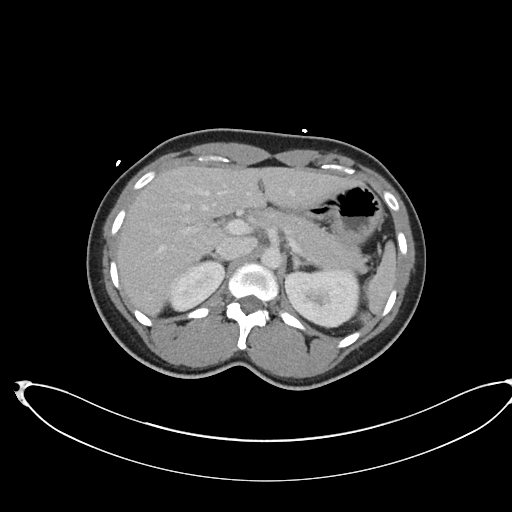
[im 81/92  soft-tissue]
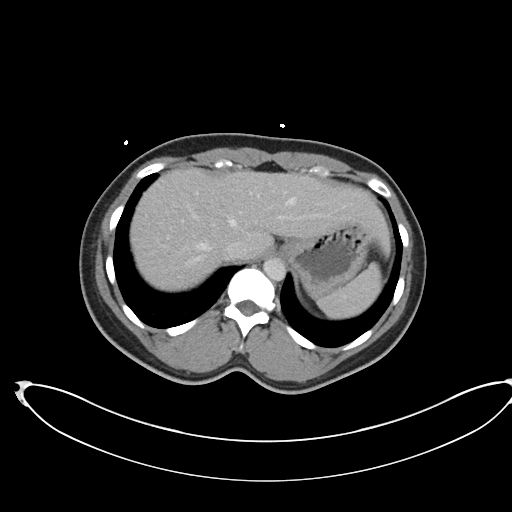
[im 88/92  soft-tissue]
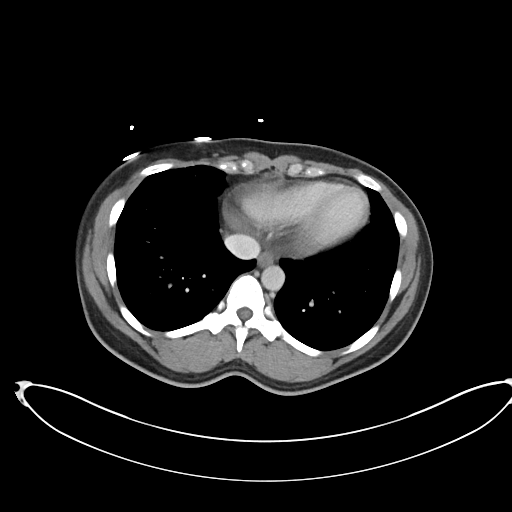

[Series 6: coronal soft tissue · coronal · 0.91mm/px · 3 of 101 slices shown]
[im 34/101  soft-tissue]
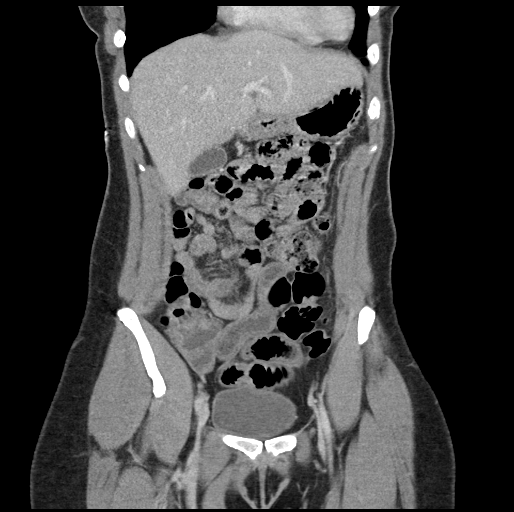
[im 45/101  soft-tissue]
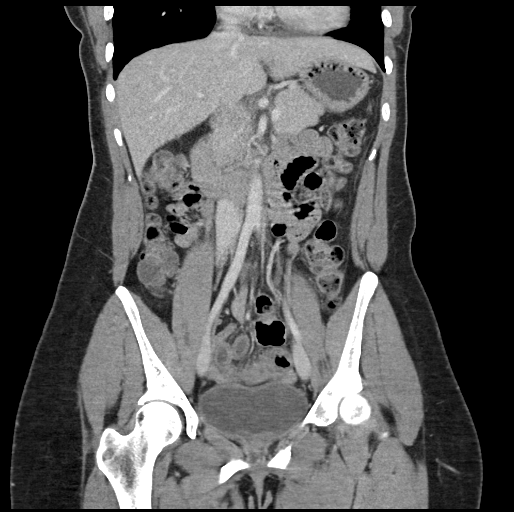
[im 56/101  soft-tissue]
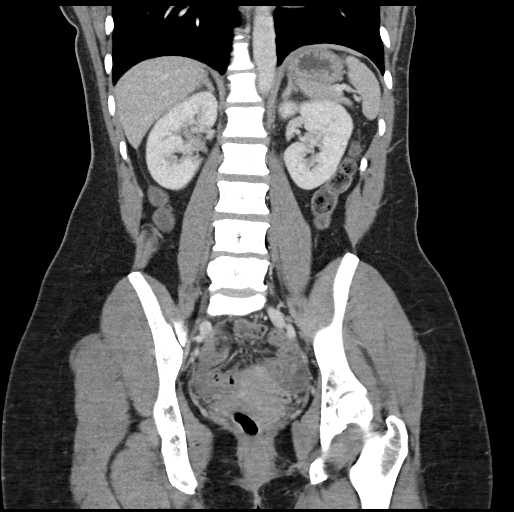

[16 of 46 positions shown; findings below may reference images not displayed]

FINDINGS: Lower chest: The lung bases are clear.

Hepatobiliary: No focal liver abnormality is seen. No gallstones,
gallbladder wall thickening, or biliary dilatation. There is a fold
in the gallbladder fundus.

Pancreas: Unremarkable. No pancreatic ductal dilatation or
surrounding inflammatory changes.

Spleen: Normal in size without focal abnormality.

Adrenals/Urinary Tract: Normal adrenal glands. No hydronephrosis or
perinephric edema. Homogeneous renal enhancement. Urinary bladder is
physiologically distended without wall thickening.

Stomach/Bowel: Bowel evaluation is limited in the absence of enteric
contrast. Moderate length segment of edematous distal small bowel in
the lower abdomen and pelvis with wall thickening and mesenteric
edema. No abnormal distension. No bowel pneumatosis or obstruction.
Normal appendix. Small volume of colonic stool. No obvious colonic
wall thickening or inflammation.

Vascular/Lymphatic: Normal caliber abdominal aorta. Duplicated IVC.
Patent portal vein. No portal venous or mesenteric gas. No enlarged
lymph nodes in the abdomen or pelvis.

Reproductive: Uterus and bilateral adnexa are unremarkable. Small
volume simple free fluid in the pelvis may be physiologic or
reactive.

Other: Small volume simple free fluid in the pelvis may be
physiologic or reactive. No upper abdominal ascites. No free air or
focal fluid collection.

Musculoskeletal: There are no acute or suspicious osseous
abnormalities.
IMPRESSION: 1. Moderate length segment of edematous distal small bowel in the
lower abdomen and pelvis with wall thickening and mesenteric edema,
consistent with enteritis, likely infectious or inflammatory.
2. Small volume simple free fluid in the pelvis may be physiologic
or reactive.
3. Duplicated IVC.

## 2024-02-13 DIAGNOSIS — Z01419 Encounter for gynecological examination (general) (routine) without abnormal findings: Secondary | ICD-10-CM | POA: Diagnosis not present

## 2024-02-13 DIAGNOSIS — Z113 Encounter for screening for infections with a predominantly sexual mode of transmission: Secondary | ICD-10-CM | POA: Diagnosis not present

## 2024-02-13 DIAGNOSIS — Z124 Encounter for screening for malignant neoplasm of cervix: Secondary | ICD-10-CM | POA: Diagnosis not present

## 2024-02-15 LAB — HM PAP SMEAR: HPV, high-risk: NEGATIVE

## 2024-03-13 ENCOUNTER — Ambulatory Visit: Admitting: Family Medicine

## 2024-03-13 VITALS — BP 118/70 | HR 87 | Ht 67.0 in | Wt 171.0 lb

## 2024-03-13 DIAGNOSIS — M25542 Pain in joints of left hand: Secondary | ICD-10-CM | POA: Diagnosis not present

## 2024-03-13 DIAGNOSIS — Z23 Encounter for immunization: Secondary | ICD-10-CM

## 2024-03-13 DIAGNOSIS — M25541 Pain in joints of right hand: Secondary | ICD-10-CM

## 2024-03-13 DIAGNOSIS — F419 Anxiety disorder, unspecified: Secondary | ICD-10-CM | POA: Diagnosis not present

## 2024-03-13 DIAGNOSIS — Z7689 Persons encountering health services in other specified circumstances: Secondary | ICD-10-CM

## 2024-03-13 DIAGNOSIS — M25561 Pain in right knee: Secondary | ICD-10-CM

## 2024-03-13 DIAGNOSIS — M25562 Pain in left knee: Secondary | ICD-10-CM | POA: Diagnosis not present

## 2024-03-13 MED ORDER — BUSPIRONE HCL 5 MG PO TABS: 5.0000 mg | ORAL_TABLET | Freq: Two times a day (BID) | ORAL | 1 refills | Status: AC

## 2024-03-13 NOTE — Progress Notes (Signed)
 New patient visit   Patient: Maureen Waters   DOB: 1986-06-06   38 y.o. Female  MRN: [id number] Visit Date: 03/13/2024  Today's healthcare provider: Mimi Alt, MD   Chief Complaint  Patient presents with   Establish Care    Discuss joint pain    Subjective    Maureen Waters is a 38 y.o. female who presents today as a new patient to establish care.   HPI     Establish Care    Additional comments: Discuss joint pain       Last edited by Bart Lieu, CMA on 03/13/2024  3:25 PM.       Discussed the use of AI scribe software for clinical note transcription with the patient, who gave verbal consent to proceed.  History of Present Illness Maureen Waters is a 38 year old female with slight osteoarthritis who presents with joint pain.  She experiences joint pain primarily in her knees and hands. Her knee pain is characterized by stiffness and a sensation of 'rubber cement,' making it difficult to bend. Her knees sometimes feel weak and can get 'stuck' or spasm. She also experiences stiffness and weakness in her hands, particularly in the right hand, affecting her grip strength. Her thumb occasionally feels swollen.  She has a history of being a Buyer, retail, which involved significant physical activity, but became less active during the pandemic, which she believes has contributed to her current symptoms. She was diagnosed with slight osteoarthritis in her knees over a decade ago but did not pursue further treatment at that time. Her knee pain worsens with activity, and she notices increased stiffness after periods of inactivity, such as sitting for long durations. Her hands tend to feel more fatigued as the day progresses, especially with extensive typing at work. She has not been taking any specific medication for her joint pain but occasionally uses Aleve, which she finds effective for muscle pain but not for her joint issues.  Her mother has a  history of arthritis affecting multiple joints, including hands, back, knees, and shoulders, and has undergone several joint replacements.  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 She has not been on any medication for anxiety and does not currently have a therapist but is seeking one. No rashes, numbness, or tingling in hands except occasional tingling in the right hand.       03/13/2024    4:34 PM 04/17/2018    2:38 PM 04/05/2018    9:00 AM  GAD 7 : Generalized Anxiety Score  Nervous, Anxious, on Edge 1    Control/stop worrying 1    Worry too much - different things 2    Trouble relaxing 2    Restless 2    Easily annoyed or irritable 2    Afraid - awful might happen 2    Total GAD 7 Score 12    Anxiety Difficulty Somewhat difficult       Information is confidential and restricted. Go to Review Flowsheets to unlock data.      Past Medical History:  Diagnosis Date   Allergy    HX OF SEASONAL ALLERGIES   Anxiety    Chlamydia    06/2010   Depression    LGSIL (low grade squamous intraepithelial lesion) on Pap smear 06/17/2010   with high risk HPV.  01/2011 pap was normal    Outpatient Medications  Prior to Visit  Medication Sig   etonogestrel (NEXPLANON) 68 MG IMPL implant 68 mg by Subdermal route once.   [DISCONTINUED] dicyclomine  (BENTYL ) 20 MG tablet Take 1 tablet (20 mg total) by mouth 3 (three) times daily as needed for spasms.   [DISCONTINUED] naproxen sodium (ALEVE) 220 MG tablet Take 220 mg by mouth daily as needed (pain).   [DISCONTINUED] ondansetron  (ZOFRAN  ODT) 4 MG disintegrating tablet Take 1 tablet (4 mg total) by mouth every 8 (eight) hours as needed for nausea or vomiting.   [DISCONTINUED] traZODone  (DESYREL ) 50 MG tablet Take 1 tablet (50 mg total) by mouth at bedtime. (Patient not taking: No sig reported)   No facility-administered medications  prior to visit.    History reviewed. No pertinent surgical history. Family Status  Relation Name Status   Mother  Alive       Recently diagnosed with PTSD   Father  Alive   Brother  Alive       allergies   Brother  Alive   Brother  Alive   Brother  Alive   MGM  (Not Specified)       uterine cancer at age 71   MGF  (Not Specified)   PGF  (Not Specified)  No partnership data on file   Family History  Problem Relation Age of Onset   Arthritis Mother    Hypertension Mother    Depression Mother    Alcohol abuse Father    Asthma Brother        and allergies   Arthritis Maternal Grandmother    Cancer Maternal Grandmother    Diabetes Maternal Grandfather    Hypertension Maternal Grandfather    Alcohol abuse Paternal Grandfather    Social History   Socioeconomic History   Marital status: Significant Other    Spouse name: Not on file   Number of children: 0   Years of education: Not on file   Highest education level: Associate degree: academic program  Occupational History   Occupation: Production designer, theatre/television/film at [employer]   Tobacco Use   Smoking status: Never   Smokeless tobacco: Never  Vaping Use   Vaping status: Never Used  Substance and Sexual Activity   Alcohol use: Yes    Alcohol/week: 1.0 standard drink of alcohol    Types: 1 Glasses of wine per week    Comment: glass of wine every couple of weeks    Drug use: Yes    Types: Marijuana    Comment: twice a month   Sexual activity: Yes    Partners: Male    Birth control/protection: Implant  Other Topics Concern   Not on file  Social History Narrative   Lives alone, no pets.  Family lives in [place]   Social Drivers of Health   Financial Resource Strain: Low Risk  (03/13/2024)   Overall Financial Resource Strain (CARDIA)    Difficulty of Paying Living Expenses: Not very hard  Food Insecurity: No Food Insecurity (03/13/2024)   Hunger Vital Sign    Worried About Running Out of Food in the Last Year: Never  true    Ran Out of Food in the Last Year: Never true  Recent Concern: Food Insecurity - Food Insecurity Present (03/13/2024)   Hunger Vital Sign    Worried About Running Out of Food in the Last Year: Sometimes true    Ran Out of Food in the Last Year: Never true  Transportation Needs: No Transportation Needs (03/13/2024)   PRAPARE - Transportation  Lack of Transportation (Medical): No    Lack of Transportation (Non-Medical): No  Physical Activity: Insufficiently Active (03/13/2024)   Exercise Vital Sign    Days of Exercise per Week: 2 days    Minutes of Exercise per Session: 60 min  Stress: Stress Concern Present (03/13/2024)   [employer] of Occupational Health - Occupational Stress Questionnaire    Feeling of Stress : To some extent  Social Connections: Socially Integrated (03/13/2024)   Social Connection and Isolation Panel [NHANES]    Frequency of Communication with Friends and Family: More than three times a week    Frequency of Social Gatherings with Friends and Family: More than three times a week    Attends Religious Services: More than 4 times per year    Active Member of Clubs or Organizations: Yes    Attends Banker Meetings: More than 4 times per year    Marital Status: Living with partner     Allergies  Allergen Reactions   Peanut-Containing Drug Products Shortness Of Breath, Swelling and Other (See Comments)    Throat swells     Immunization History  Administered Date(s) Administered   DTaP 08/19/1986, 11/04/1986, 01/20/1987, 04/01/1991   Influenza,inj,Quad PF,6+ Mos 08/22/2016   MMR 04/01/1991, 04/28/2004, 05/31/2004   Meningococcal Conjugate 05/06/2004   PFIZER(Purple Top)SARS-COV-2 Vaccination 02/14/2020, 03/10/2020, 10/08/2020   Polio, Unspecified 08/19/1986, 01/20/1987, 04/01/1991, 10/06/1991   Td 09/02/1997   Tdap 06/16/2010, 03/13/2024    Health Maintenance  Topic Date Due   Hepatitis C Screening  Never done   Cervical Cancer Screening  (HPV/Pap Cotest)  07/08/2019   COVID-19 Vaccine (4 - 2024-25 season) 07/08/2023   INFLUENZA VACCINE  06/06/2024   DTaP/Tdap/Td (8 - Td or Tdap) 03/13/2034   HIV Screening  Completed   HPV VACCINES  Aged Out   Meningococcal B Vaccine  Aged Out    Patient Care Team: Mimi Alt, MD as PCP - General (Family Medicine) Atlas Blank, DO as Consulting Physician (Obstetrics and Gynecology)  Review of Systems      Objective    BP 118/70   Pulse 87   Ht 5\' 7"  (1.702 m)   Wt 171 lb (77.6 kg)   SpO2 100%   BMI 26.78 kg/m      Depression Screen    03/13/2024    4:34 PM 04/17/2018    2:37 PM 04/05/2018    9:00 AM 08/22/2016    2:45 PM  PHQ 2/9 Scores  PHQ - 2 Score 2   0  PHQ- 9 Score 7        Information is confidential and restricted. Go to Review Flowsheets to unlock data.   No results found for any visits on 03/13/24.   Physical Exam Knee: - Inspection: no gross deformity. Mild right LE swelling/ no effusion visualized on exam,no erythema or bruising. Skin intact - Palpation:  TTP on medial aspect of both knees  - ROM: full active ROM with flexion and extension in knee and hip - Strength: 5/5 strength in lower extremities  - Neuro/vasc: NV intact - Special Tests: - LIGAMENTS:negative Lachman's, no MCL or LCL laxity    Wrists: negative Tinels, +phalen test on right hand, no thenar nor hypothenar atorpy, 4/5 interosseous muscles on right hand      Assessment & Plan      Problem List Items Addressed This Visit       Other   Arthralgia of hands, bilateral   Symptoms suggestive of carpal tunnel syndrome in  the right wrist, including tingling and weakness, particularly during work-related activities. Nocturnal symptoms not reported, suggesting mild to moderate severity. Recommended conservative management with wrist brace use. - Recommend use of a wrist brace at night and during heavy typing to maintain a neutral wrist position      Relevant  Orders   AMB referral to orthopedics   Arthralgia of both knees   Chronic  Long history of cheer/dance instructor now, +theater sign Given lack of response to NSAIDS OTC, recommended orthopedic referral today Osteoarthritis of knees and hands Chronic osteoarthritis in knees and hands, with symptoms of stiffness, weakness, and occasional swelling, particularly in the right knee. Symptoms exacerbated by activity and improve with rest. Current symptoms suggest progression, with right knee appearing slightly puffier and weaker grip strength in the right hand. Discussed limited relief from Aleve for joint pain and potential need for more than NSAIDs for symptom management. - Refer to orthopedics for further evaluation and management of knee and hand osteoarthritis. - Consider x-rays of knees and hands to assess the extent of osteoarthritis, to be coordinated with orthopedics.      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 She expresses concern about medication side effects and has experienced low energy and depressive symptoms in the past. No current therapy or counseling in place. Discussed Buspar  as a preferred option due to its tolerability and potential for as-needed use. - Prescribe Buspar  5 mg twice daily as needed for anxiety management. - Discuss potential side effects of Buspar , including rare reports of sleepiness, and advise against alcohol consumption while on medication. - Encourage follow-up in one month to assess response to Buspar  and adjust treatment as necessary. - Recommend seeking therapy or counseling for additional support. Pt states that she is looking to establish with therapist        Relevant Medications   busPIRone  (BUSPAR ) 5 MG tablet   Other Visit Diagnoses       Need for tetanus booster       Relevant Orders   Tdap  vaccine greater than or equal to 7yo IM (Completed)     Establishing care with new doctor, encounter for           Assessment & Plan Wellness Visit Routine wellness visit to establish care. Blood pressure is well-controlled at 118/70 mmHg. Recent Pap smear performed on February 13, 2024, results pending update in the system. - Request Pap smear results from [facility] to update records. - Administered tetanus booster as last received in 2011.     Return in about 1 month (around 04/13/2024) for anxiety .      Mimi Alt, MD  [facility] [phone] (phone) [phone] (fax)  [facility]

## 2024-03-14 NOTE — Assessment & Plan Note (Signed)
 Symptoms suggestive of carpal tunnel syndrome in the right wrist, including tingling and weakness, particularly during work-related activities. Nocturnal symptoms not reported, suggesting mild to moderate severity. Recommended conservative management with wrist brace use. - Recommend use of a wrist brace at night and during heavy typing to maintain a neutral wrist position

## 2024-03-14 NOTE — Assessment & Plan Note (Signed)
 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 She expresses concern about medication side effects and has experienced low energy and depressive symptoms in the past. No current therapy or counseling in place. Discussed Buspar  as a preferred option due to its tolerability and potential for as-needed use. - Prescribe Buspar  5 mg twice daily as needed for anxiety management. - Discuss potential side effects of Buspar , including rare reports of sleepiness, and advise against alcohol consumption while on medication. - Encourage follow-up in one month to assess response to Buspar  and adjust treatment as necessary. - Recommend seeking therapy or counseling for additional support. Pt states that she is looking to establish with therapist

## 2024-03-14 NOTE — Assessment & Plan Note (Signed)
 Chronic  Long history of cheer/dance instructor now, +theater sign Given lack of response to NSAIDS OTC, recommended orthopedic referral today Osteoarthritis of knees and hands Chronic osteoarthritis in knees and hands, with symptoms of stiffness, weakness, and occasional swelling, particularly in the right knee. Symptoms exacerbated by activity and improve with rest. Current symptoms suggest progression, with right knee appearing slightly puffier and weaker grip strength in the right hand. Discussed limited relief from Aleve for joint pain and potential need for more than NSAIDs for symptom management. - Refer to orthopedics for further evaluation and management of knee and hand osteoarthritis. - Consider x-rays of knees and hands to assess the extent of osteoarthritis, to be coordinated with orthopedics.

## 2024-04-14 ENCOUNTER — Ambulatory Visit: Admitting: Family Medicine

## 2024-04-14 NOTE — Progress Notes (Deleted)
      Established patient visit   Patient: Maureen Waters   DOB: Feb 17, 1986   38 y.o. Female  MRN: [id number] Visit Date: 04/14/2024  Today's healthcare provider: Mimi Alt, MD   No chief complaint on file.  Subjective       Discussed the use of AI scribe software for clinical note transcription with the patient, who gave verbal consent to proceed.  History of Present Illness      Past Medical History:  Diagnosis Date   Allergy    HX OF SEASONAL ALLERGIES   Anxiety    Chlamydia    06/2010   Depression    LGSIL (low grade squamous intraepithelial lesion) on Pap smear 06/17/2010   with high risk HPV.  01/2011 pap was normal    Medications: Outpatient Medications Prior to Visit  Medication Sig   busPIRone  (BUSPAR ) 5 MG tablet Take 1 tablet (5 mg total) by mouth 2 (two) times daily.   etonogestrel (NEXPLANON) 68 MG IMPL implant 68 mg by Subdermal route once.   No facility-administered medications prior to visit.    Review of Systems  Last metabolic panel Lab Results  Component Value Date   GLUCOSE 120 (H) 01/13/2021   NA 138 01/13/2021   K 4.0 01/13/2021   CL 105 01/13/2021   CO2 25 01/13/2021   BUN 9 01/13/2021   CREATININE 0.72 01/13/2021   GFRNONAA >60 01/13/2021   CALCIUM 9.1 01/13/2021   PROT 7.5 01/13/2021   ALBUMIN 4.2 01/13/2021   BILITOT 1.2 01/13/2021   ALKPHOS 54 01/13/2021   AST 16 01/13/2021   ALT 10 01/13/2021   ANIONGAP 8 01/13/2021   Last lipids Lab Results  Component Value Date   CHOL 119 12/29/2011   HDL 61 12/29/2011   LDLCALC 52 12/29/2011   TRIG 29 12/29/2011   CHOLHDL 2.0 12/29/2011   Last hemoglobin A1c No results found for: "HGBA1C" Last thyroid functions No results found for: "TSH", "T3TOTAL", "T4TOTAL", "THYROIDAB"   {See past labs  Heme  Chem  Endocrine  Serology  Results Review (optional):1}   Objective    There were no vitals taken for this visit. BP Readings from Last 3 Encounters:   03/13/24 118/70  01/13/21 128/67  10/25/14 115/67   Wt Readings from Last 3 Encounters:  03/13/24 171 lb (77.6 kg)  10/25/14 130 lb (59 kg)  06/08/14 135 lb (61.2 kg)    {See vitals history (optional):1}    Physical Exam  ***  No results found for any visits on 04/14/24.  Assessment & Plan     Problem List Items Addressed This Visit       Other   Anxiety - Primary     Assessment & Plan      No follow-ups on file.         Mimi Alt, MD  [facility] [phone] (phone) [phone] (fax)  [facility]

## 2025-01-14 ENCOUNTER — Ambulatory Visit: Admitting: Family Medicine
